# Patient Record
Sex: Female | Born: 1950 | Race: White | Hispanic: No | Smoking: Former smoker
Health system: Southern US, Community
[De-identification: ages and names within clinical notes are randomized; demographics above are authoritative.]

## PROBLEM LIST (undated history)

## (undated) DIAGNOSIS — I10 Essential (primary) hypertension: Secondary | ICD-10-CM

## (undated) DIAGNOSIS — E785 Hyperlipidemia, unspecified: Secondary | ICD-10-CM

## (undated) DIAGNOSIS — A64 Unspecified sexually transmitted disease: Secondary | ICD-10-CM

## (undated) DIAGNOSIS — R32 Unspecified urinary incontinence: Secondary | ICD-10-CM

## (undated) DIAGNOSIS — D72819 Decreased white blood cell count, unspecified: Secondary | ICD-10-CM

## (undated) DIAGNOSIS — M8580 Other specified disorders of bone density and structure, unspecified site: Secondary | ICD-10-CM

## (undated) DIAGNOSIS — M810 Age-related osteoporosis without current pathological fracture: Secondary | ICD-10-CM

## (undated) DIAGNOSIS — R319 Hematuria, unspecified: Secondary | ICD-10-CM

## (undated) DIAGNOSIS — R311 Benign essential microscopic hematuria: Secondary | ICD-10-CM

## (undated) DIAGNOSIS — E079 Disorder of thyroid, unspecified: Secondary | ICD-10-CM

## (undated) DIAGNOSIS — M5416 Radiculopathy, lumbar region: Secondary | ICD-10-CM

## (undated) HISTORY — DX: Radiculopathy, lumbar region: M54.16

## (undated) HISTORY — PX: WISDOM TOOTH EXTRACTION: SHX21

## (undated) HISTORY — DX: Other specified disorders of bone density and structure, unspecified site: M85.80

## (undated) HISTORY — DX: Decreased white blood cell count, unspecified: D72.819

## (undated) HISTORY — DX: Disorder of thyroid, unspecified: E07.9

## (undated) HISTORY — DX: Age-related osteoporosis without current pathological fracture: M81.0

## (undated) HISTORY — DX: Hematuria, unspecified: R31.9

## (undated) HISTORY — DX: Essential (primary) hypertension: I10

## (undated) HISTORY — DX: Hyperlipidemia, unspecified: E78.5

## (undated) HISTORY — DX: Benign essential microscopic hematuria: R31.1

## (undated) HISTORY — PX: TOTAL VAGINAL HYSTERECTOMY: SHX2548

## (undated) HISTORY — DX: Unspecified urinary incontinence: R32

## (undated) HISTORY — PX: TONSILLECTOMY: SUR1361

## (undated) HISTORY — PX: COLONOSCOPY: SHX174

## (undated) HISTORY — DX: Unspecified sexually transmitted disease: A64

---

## 2002-03-04 ENCOUNTER — Encounter: Payer: Self-pay | Admitting: Obstetrics and Gynecology

## 2002-03-04 ENCOUNTER — Encounter: Admission: RE | Admit: 2002-03-04 | Discharge: 2002-03-04 | Payer: Self-pay | Admitting: Obstetrics and Gynecology

## 2002-04-14 ENCOUNTER — Other Ambulatory Visit: Admission: RE | Admit: 2002-04-14 | Discharge: 2002-04-14 | Payer: Self-pay | Admitting: Obstetrics and Gynecology

## 2003-03-09 ENCOUNTER — Encounter: Admission: RE | Admit: 2003-03-09 | Discharge: 2003-03-09 | Payer: Self-pay | Admitting: Internal Medicine

## 2003-04-16 ENCOUNTER — Other Ambulatory Visit: Admission: RE | Admit: 2003-04-16 | Discharge: 2003-04-16 | Payer: Self-pay | Admitting: Obstetrics and Gynecology

## 2004-02-05 ENCOUNTER — Ambulatory Visit: Payer: Self-pay | Admitting: Internal Medicine

## 2004-02-15 ENCOUNTER — Ambulatory Visit: Payer: Self-pay | Admitting: Internal Medicine

## 2004-03-09 ENCOUNTER — Encounter: Admission: RE | Admit: 2004-03-09 | Discharge: 2004-03-09 | Payer: Self-pay | Admitting: Obstetrics and Gynecology

## 2004-04-18 ENCOUNTER — Other Ambulatory Visit: Admission: RE | Admit: 2004-04-18 | Discharge: 2004-04-18 | Payer: Self-pay | Admitting: Obstetrics and Gynecology

## 2005-01-09 ENCOUNTER — Ambulatory Visit: Payer: Self-pay | Admitting: Internal Medicine

## 2005-03-13 ENCOUNTER — Encounter: Admission: RE | Admit: 2005-03-13 | Discharge: 2005-03-13 | Payer: Self-pay | Admitting: Obstetrics and Gynecology

## 2005-04-21 ENCOUNTER — Other Ambulatory Visit: Admission: RE | Admit: 2005-04-21 | Discharge: 2005-04-21 | Payer: Self-pay | Admitting: Obstetrics and Gynecology

## 2005-05-02 ENCOUNTER — Ambulatory Visit: Payer: Self-pay | Admitting: Internal Medicine

## 2005-05-09 ENCOUNTER — Ambulatory Visit: Payer: Self-pay | Admitting: Internal Medicine

## 2005-06-08 ENCOUNTER — Ambulatory Visit: Payer: Self-pay | Admitting: Internal Medicine

## 2006-03-27 ENCOUNTER — Encounter: Admission: RE | Admit: 2006-03-27 | Discharge: 2006-03-27 | Payer: Self-pay | Admitting: Internal Medicine

## 2006-04-23 ENCOUNTER — Other Ambulatory Visit: Admission: RE | Admit: 2006-04-23 | Discharge: 2006-04-23 | Payer: Self-pay | Admitting: Obstetrics & Gynecology

## 2006-05-04 ENCOUNTER — Ambulatory Visit: Payer: Self-pay | Admitting: Internal Medicine

## 2006-05-04 LAB — CONVERTED CEMR LAB
ALT: 34 units/L (ref 0–40)
AST: 29 units/L (ref 0–37)
Basophils Absolute: 0 10*3/uL (ref 0.0–0.1)
Basophils Relative: 0.7 % (ref 0.0–1.0)
Chloride: 108 meq/L (ref 96–112)
Cholesterol: 211 mg/dL (ref 0–200)
Creatinine, Ser: 0.8 mg/dL (ref 0.4–1.2)
Eosinophils Relative: 2 % (ref 0.0–5.0)
GFR calc Af Amer: 95 mL/min
GFR calc non Af Amer: 79 mL/min
Glucose, Bld: 94 mg/dL (ref 70–99)
HDL: 51.3 mg/dL (ref >39.0)
Lymphocytes Relative: 35 % (ref 12.0–46.0)
Monocytes Absolute: 0.4 10*3/uL (ref 0.2–0.7)
Neutro Abs: 3 10*3/uL (ref 1.4–7.7)
Neutrophils Relative %: 54.6 % (ref 43.0–77.0)
RBC: 4.09 M/uL (ref 3.87–5.11)
RDW: 12.2 % (ref 11.5–14.6)
Sodium: 144 meq/L (ref 135–145)
TSH: 0.33 microintl units/mL — ABNORMAL LOW (ref 0.35–5.50)
Total Bilirubin: 0.8 mg/dL (ref 0.3–1.2)
Total CHOL/HDL Ratio: 4.1
Triglycerides: 143 mg/dL (ref 0–149)
VLDL: 29 mg/dL (ref 0–40)

## 2006-05-11 ENCOUNTER — Ambulatory Visit: Payer: Self-pay | Admitting: Internal Medicine

## 2006-05-11 LAB — CONVERTED CEMR LAB: Pap Smear: NORMAL

## 2006-07-10 ENCOUNTER — Ambulatory Visit: Payer: Self-pay | Admitting: Internal Medicine

## 2006-10-25 ENCOUNTER — Ambulatory Visit: Payer: Self-pay | Admitting: Internal Medicine

## 2006-10-25 LAB — CONVERTED CEMR LAB: Vit D, 1,25-Dihydroxy: 45 (ref 20–57)

## 2007-01-16 DIAGNOSIS — M899 Disorder of bone, unspecified: Secondary | ICD-10-CM | POA: Insufficient documentation

## 2007-01-16 DIAGNOSIS — M949 Disorder of cartilage, unspecified: Secondary | ICD-10-CM

## 2007-04-11 LAB — CONVERTED CEMR LAB: Pap Smear: NORMAL

## 2007-05-10 ENCOUNTER — Ambulatory Visit: Payer: Self-pay | Admitting: Internal Medicine

## 2007-05-10 LAB — CONVERTED CEMR LAB
Alkaline Phosphatase: 36 units/L — ABNORMAL LOW (ref 39–117)
BUN: 15 mg/dL (ref 6–23)
CO2: 32 meq/L (ref 19–32)
Creatinine, Ser: 0.8 mg/dL (ref 0.4–1.2)
Direct LDL: 114.9 mg/dL
Eosinophils Absolute: 0.1 10*3/uL (ref 0.0–0.6)
GFR calc Af Amer: 95 mL/min
GFR calc non Af Amer: 79 mL/min
HCT: 38.3 % (ref 36.0–46.0)
HDL: 41.7 mg/dL (ref >39.0)
Lymphocytes Relative: 32.2 % (ref 12.0–46.0)
MCHC: 32.7 g/dL (ref 30.0–36.0)
MCV: 94.3 fL (ref 78.0–100.0)
Monocytes Absolute: 0.5 10*3/uL (ref 0.2–0.7)
Monocytes Relative: 8.1 % (ref 3.0–11.0)
Neutrophils Relative %: 57.8 % (ref 43.0–77.0)
Platelets: 213 10*3/uL (ref 150–400)
Potassium: 4 meq/L (ref 3.5–5.1)
RBC: 4.06 M/uL (ref 3.87–5.11)
Sodium: 143 meq/L (ref 135–145)
Total Bilirubin: 0.9 mg/dL (ref 0.3–1.2)
Total CHOL/HDL Ratio: 4.7
Total Protein: 6.5 g/dL (ref 6.0–8.3)
VLDL: 41 mg/dL — ABNORMAL HIGH (ref 0–40)
WBC: 5.6 10*3/uL (ref 4.5–10.5)

## 2007-05-12 LAB — CONVERTED CEMR LAB
Bilirubin Urine: NEGATIVE
Glucose, Urine, Semiquant: NEGATIVE
Ketones, urine, test strip: NEGATIVE
Nitrite: NEGATIVE
Specific Gravity, Urine: 1.02
Urobilinogen, UA: 0.2

## 2007-05-17 ENCOUNTER — Ambulatory Visit: Payer: Self-pay | Admitting: Internal Medicine

## 2007-05-17 DIAGNOSIS — R319 Hematuria, unspecified: Secondary | ICD-10-CM | POA: Insufficient documentation

## 2007-05-17 DIAGNOSIS — E781 Pure hyperglyceridemia: Secondary | ICD-10-CM | POA: Insufficient documentation

## 2007-05-17 HISTORY — DX: Hematuria, unspecified: R31.9

## 2007-05-17 LAB — CONVERTED CEMR LAB
Bilirubin Urine: NEGATIVE
Protein, U semiquant: NEGATIVE
Urobilinogen, UA: 0.2

## 2007-05-24 ENCOUNTER — Other Ambulatory Visit: Admission: RE | Admit: 2007-05-24 | Discharge: 2007-05-24 | Payer: Self-pay | Admitting: Obstetrics and Gynecology

## 2008-02-01 ENCOUNTER — Encounter: Payer: Self-pay | Admitting: Internal Medicine

## 2008-02-10 ENCOUNTER — Ambulatory Visit: Payer: Self-pay | Admitting: Internal Medicine

## 2008-02-10 DIAGNOSIS — J069 Acute upper respiratory infection, unspecified: Secondary | ICD-10-CM | POA: Insufficient documentation

## 2008-04-08 ENCOUNTER — Ambulatory Visit: Payer: Self-pay | Admitting: Internal Medicine

## 2008-04-08 DIAGNOSIS — M79609 Pain in unspecified limb: Secondary | ICD-10-CM | POA: Insufficient documentation

## 2008-04-08 DIAGNOSIS — E785 Hyperlipidemia, unspecified: Secondary | ICD-10-CM

## 2008-04-08 HISTORY — DX: Hyperlipidemia, unspecified: E78.5

## 2008-04-15 ENCOUNTER — Encounter: Payer: Self-pay | Admitting: Internal Medicine

## 2008-04-17 ENCOUNTER — Encounter: Payer: Self-pay | Admitting: Internal Medicine

## 2008-05-13 ENCOUNTER — Ambulatory Visit: Payer: Self-pay | Admitting: Internal Medicine

## 2008-05-26 ENCOUNTER — Ambulatory Visit: Payer: Self-pay | Admitting: Internal Medicine

## 2008-07-20 ENCOUNTER — Ambulatory Visit: Payer: Self-pay | Admitting: Internal Medicine

## 2008-07-20 DIAGNOSIS — R21 Rash and other nonspecific skin eruption: Secondary | ICD-10-CM | POA: Insufficient documentation

## 2009-03-18 ENCOUNTER — Telehealth: Payer: Self-pay | Admitting: Internal Medicine

## 2009-04-08 ENCOUNTER — Encounter: Payer: Self-pay | Admitting: Internal Medicine

## 2009-04-16 ENCOUNTER — Encounter: Payer: Self-pay | Admitting: Internal Medicine

## 2009-04-16 LAB — HM MAMMOGRAPHY

## 2009-05-26 ENCOUNTER — Ambulatory Visit: Payer: Self-pay | Admitting: Internal Medicine

## 2009-05-26 LAB — CONVERTED CEMR LAB
Basophils Absolute: 0 10*3/uL (ref 0.0–0.1)
Basophils Relative: 0.1 % (ref 0.0–3.0)
Bilirubin Urine: NEGATIVE
Bilirubin, Direct: 0.1 mg/dL (ref 0.0–0.3)
Blood in Urine, dipstick: NEGATIVE
Chloride: 108 meq/L (ref 96–112)
Cholesterol: 210 mg/dL — ABNORMAL HIGH (ref 0–200)
Direct LDL: 131.6 mg/dL
Eosinophils Absolute: 0.1 10*3/uL (ref 0.0–0.7)
GFR calc non Af Amer: 77.89 mL/min (ref >60)
HDL: 54.1 mg/dL (ref >39.00)
Lymphocytes Relative: 29.7 % (ref 12.0–46.0)
Lymphs Abs: 1.5 10*3/uL (ref 0.7–4.0)
MCHC: 33.8 g/dL (ref 30.0–36.0)
Neutrophils Relative %: 61.2 % (ref 43.0–77.0)
Nitrite: NEGATIVE
Potassium: 3.7 meq/L (ref 3.5–5.1)
Protein, U semiquant: NEGATIVE
RDW: 12.3 % (ref 11.5–14.6)
Sodium: 143 meq/L (ref 135–145)
Total Bilirubin: 0.7 mg/dL (ref 0.3–1.2)
Total Protein: 6.9 g/dL (ref 6.0–8.3)
Triglycerides: 175 mg/dL — ABNORMAL HIGH (ref 0.0–149.0)
Vit D, 25-Hydroxy: 32 ng/mL (ref 30–89)
WBC Urine, dipstick: NEGATIVE

## 2009-06-02 ENCOUNTER — Ambulatory Visit: Payer: Self-pay | Admitting: Internal Medicine

## 2009-08-19 ENCOUNTER — Encounter (INDEPENDENT_AMBULATORY_CARE_PROVIDER_SITE_OTHER): Payer: Self-pay | Admitting: *Deleted

## 2009-08-30 ENCOUNTER — Ambulatory Visit: Payer: Self-pay | Admitting: Internal Medicine

## 2009-09-02 LAB — CONVERTED CEMR LAB
Free T4: 0.8 ng/dL (ref 0.6–1.6)
T3, Free: 2.7 pg/mL (ref 2.3–4.2)

## 2009-09-17 ENCOUNTER — Telehealth: Payer: Self-pay | Admitting: *Deleted

## 2009-09-24 ENCOUNTER — Encounter (INDEPENDENT_AMBULATORY_CARE_PROVIDER_SITE_OTHER): Payer: Self-pay | Admitting: *Deleted

## 2009-09-29 ENCOUNTER — Ambulatory Visit: Payer: Self-pay | Admitting: Internal Medicine

## 2009-10-22 ENCOUNTER — Ambulatory Visit: Payer: Self-pay | Admitting: Endocrinology

## 2009-10-22 DIAGNOSIS — E042 Nontoxic multinodular goiter: Secondary | ICD-10-CM | POA: Insufficient documentation

## 2009-11-01 ENCOUNTER — Telehealth (INDEPENDENT_AMBULATORY_CARE_PROVIDER_SITE_OTHER): Payer: Self-pay | Admitting: *Deleted

## 2009-12-21 ENCOUNTER — Telehealth: Payer: Self-pay | Admitting: Endocrinology

## 2009-12-21 ENCOUNTER — Ambulatory Visit: Payer: Self-pay | Admitting: Endocrinology

## 2010-02-17 ENCOUNTER — Encounter (HOSPITAL_COMMUNITY)
Admission: RE | Admit: 2010-02-17 | Discharge: 2010-04-08 | Payer: Self-pay | Source: Home / Self Care | Attending: Endocrinology | Admitting: Endocrinology

## 2010-02-21 ENCOUNTER — Telehealth: Payer: Self-pay | Admitting: Endocrinology

## 2010-03-02 ENCOUNTER — Ambulatory Visit (HOSPITAL_COMMUNITY)
Admission: RE | Admit: 2010-03-02 | Discharge: 2010-03-02 | Payer: Self-pay | Source: Home / Self Care | Admitting: Endocrinology

## 2010-04-21 ENCOUNTER — Encounter: Payer: Self-pay | Admitting: Internal Medicine

## 2010-05-08 LAB — CONVERTED CEMR LAB
AST: 20 units/L (ref 0–37)
Albumin ELP: 61.2 % (ref 55.8–66.1)
Albumin: 3.7 g/dL (ref 3.5–5.2)
Alkaline Phosphatase: 46 units/L (ref 39–117)
BUN: 16 mg/dL (ref 6–23)
Basophils Relative: 0.1 % (ref 0.0–3.0)
Bilirubin Urine: NEGATIVE
Bilirubin, Direct: 0.1 mg/dL (ref 0.0–0.3)
Blood in Urine, dipstick: NEGATIVE
Eosinophils Relative: 2.1 % (ref 0.0–5.0)
Ferritin: 67.4 ng/mL (ref 10.0–291.0)
GFR calc Af Amer: 95 mL/min
GFR calc non Af Amer: 78 mL/min
HCT: 36.6 % (ref 36.0–46.0)
Hemoglobin: 12.8 g/dL (ref 12.0–15.0)
Ketones, urine, test strip: NEGATIVE
MCHC: 35 g/dL (ref 30.0–36.0)
MCV: 91.8 fL (ref 78.0–100.0)
Monocytes Relative: 8.6 % (ref 3.0–12.0)
Neutro Abs: 2.6 10*3/uL (ref 1.4–7.7)
Neutrophils Relative %: 57.3 % (ref 43.0–77.0)
Platelets: 216 10*3/uL (ref 150–400)
Potassium: 3.9 meq/L (ref 3.5–5.1)
Protein, U semiquant: NEGATIVE
RDW: 12 % (ref 11.5–14.6)
Specific Gravity, Urine: 1.025
Total Bilirubin: 0.6 mg/dL (ref 0.3–1.2)
Total Protein, Serum Electrophoresis: 6.8 g/dL (ref 6.0–8.3)
WBC Urine, dipstick: NEGATIVE
pH: 5

## 2010-05-10 NOTE — Letter (Signed)
Summary: Previsit letter  [facility]  [address] [place], [place] [address]   Phone: [phone]  Fax: [phone]       08/19/2009 MRN: [id number]  [facility] [place] [address] [place], [place]  [address]  Dear [name],  Welcome to the Gastroenterology Division at [facility].    You are scheduled to see a nurse for your pre-procedure visit on 09-07-09 at 9:30am on the 3rd floor at [facility], [address]  We ask that you try to arrive at our office 15 minutes prior to your appointment time to allow for check-in.  Your nurse visit will consist of discussing your medical and surgical history, your immediate family medical history, and your medications.    Please bring a complete list of all your medications or, if you prefer, bring the medication bottles and we will list them.  We will need to be aware of both prescribed and over the counter drugs.  We will need to know exact dosage information as well.  If you are on blood thinners (Coumadin, Plavix, Aggrenox, Ticlid, etc.) please call our office today/prior to your appointment, as we need to consult with your physician about holding your medication.   Please be prepared to read and sign documents such as consent forms, a financial agreement, and acknowledgement forms.  If necessary, and with your consent, a friend or relative is welcome to sit-in on the nurse visit with you.  Please bring your insurance card so that we may make a copy of it.  If your insurance requires a referral to see a specialist, please bring your referral form from your primary care physician.  No co-pay is required for this nurse visit.     If you cannot keep your appointment, please call [phone] to cancel or reschedule prior to your appointment date.  This allows [place] the opportunity to schedule an appointment for another patient in need of care.    Thank you for choosing [facility] for your medical needs.   We appreciate the opportunity to care for you.  Please visit [place] at our website  to learn more about our practice.                     Sincerely.                                                                                                                   The Gastroenterology Division

## 2010-05-10 NOTE — Progress Notes (Signed)
  Phone Note Other Incoming   Request: Send information Summary of Call: Request for records received from [facility]. Request forwarded to [employer].     

## 2010-05-10 NOTE — Letter (Signed)
Summary: [facility] Instructions  [facility]  [address] [place], [place] [address]   Phone: [phone]  Fax: [phone]       Laura Brooks    May 23, 1950    MRN: [id number]       Procedure Day Dorna Bloom:  [facility]  10/13/09     Arrival Time:  8:30AM     Procedure Time:  9:30AM     Location of Procedure:                    _X_  [facility] (4th Floor)    PREPARATION FOR COLONOSCOPY WITH MIRALAX  Starting 5 days prior to your procedure 10/08/09 do not eat nuts, seeds, popcorn, corn, beans, peas,  salads, or any raw vegetables.  Do not take any fiber supplements (e.g. Metamucil, Citrucel, and Benefiber). ____________________________________________________________________________________________________   THE DAY BEFORE YOUR PROCEDURE         DATE: 10/12/09  DAY: TUESDAY  1   Drink clear liquids the entire day-NO SOLID FOOD  2   Do not drink anything colored red or purple.  Avoid juices with pulp.  No orange juice.  3   Drink at least 64 oz. (8 glasses) of fluid/clear liquids during the day to prevent dehydration and help the prep work efficiently.  CLEAR LIQUIDS INCLUDE: Water Jello Ice Popsicles Tea (sugar ok, no milk/cream) Powdered fruit flavored drinks Coffee (sugar ok, no milk/cream) Gatorade Juice: apple, white grape, white cranberry  Lemonade Clear bullion, consomm, broth Carbonated beverages (any kind) Strained chicken noodle soup Hard Candy  4   Mix the entire bottle of Miralax with 64 oz. of Gatorade/Powerade in the morning and put in the refrigerator to chill.  5   At 3:00 pm take 2 Dulcolax/Bisacodyl tablets.  6   At 4:30 pm take one Reglan/Metoclopramide tablet.  7  Starting at 5:00 pm drink one 8 oz glass of the Miralax mixture every 15-20 minutes until you have finished drinking the entire 64 oz.  You should finish drinking prep around 7:30 or 8:00 pm.  8   If you are nauseated, you may take the 2nd Reglan/Metoclopramide  tablet at 6:30 pm.        9    At 8:00 pm take 2 more DULCOLAX/Bisacodyl tablets.     THE DAY OF YOUR PROCEDURE      DATE:  10/13/09  DAY: Lulu Riding  You may drink clear liquids until 7:30AM  (2 HOURS BEFORE PROCEDURE).   MEDICATION INSTRUCTIONS  Unless otherwise instructed, you should take regular prescription medications with a small sip of water as early as possible the morning of your procedure.        OTHER INSTRUCTIONS  You will need a responsible adult at least 61 years of age to accompany you and drive you home.   This person must remain in the waiting room during your procedure.  Wear loose fitting clothing that is easily removed.  Leave jewelry and other valuables at home.  However, you may wish to bring a book to read or an iPod/MP3 player to listen to music as you wait for your procedure to start.  Remove all body piercing jewelry and leave at home.  Total time from sign-in until discharge is approximately 2-3 hours.  You should go home directly after your procedure and rest.  You can resume normal activities the day after your procedure.  The day of your procedure you should not:   Drive  Make legal decisions   Operate machinery   Drink alcohol   Return to work  You will receive specific instructions about eating, activities and medications before you leave.   The above instructions have been reviewed and explained to me by   Wyona Almas RN  September 29, 2009 8:34 AM     I fully understand and can verbalize these instructions _____________________________ Date _______

## 2010-05-10 NOTE — Assessment & Plan Note (Signed)
Summary: NEW / ABN TSH / PANOSH / NOTES ON EMR / CD   Vital Signs:  Patient profile:   60 year old female Menstrual status:  postmenopausal Height:      61 inches (154.94 cm) Weight:      155.13 pounds (70.51 kg) BMI:     29.42 O2 Sat:      98 % on Room air Temp:     98.2 degrees F (36.78 degrees C) oral Pulse rate:   71 / minute BP sitting:   112 / 74  (left arm) Cuff size:   regular  Vitals Entered By: Brenton Grills MA (October 22, 2009 8:26 AM)  O2 Flow:  Room air CC: New Endo/Abn TSH Dr. Loralee Pacas Comments Pt is currently taking Vitamin D3 and Premarin--Valtrex as needed   CC:  New Endo/Abn TSH Dr. Loralee Pacas.  History of Present Illness: pt has been noted over the past few years to have low-normal tsh.  this year, it is low.  she has few years of slight itching, worst at the scalp.  no associated rash.  pt has a high ins deductible this year.  she will have tah next year.  Current Medications (verified): 1)  Valtrex 1 Gm Tabs (Valacyclovir Hcl) 2)  Vitamin D3 1000 Unit Tabs (Cholecalciferol) .... Take 3)  Premarin 0.625 Mg/gm Crea (Estrogens, Conjugated) 4)  Metoclopramide Hcl 10 Mg  Tabs (Metoclopramide Hcl) .... As Per Prep Instructions. 5)  Miralax   Powd (Polyethylene Glycol 3350) .... As Per Prep  Instructions.  Allergies (verified): No Known Drug Allergies  Past History:  Past Medical History: Last updated: 06/02/2009 Osteopenia g2p2 microscopic hematuria  had evaluation bone health Radiculopathy     l4-5  Consults: Dr. Nilsa Nutting   Ortho        LAST Mammogram: 2009 Pap: scheduled for 05/29/2008 Td: 09/1998 Colonscopy: 2001 EKG: 05/11/2006 Dexa: 2009 Eye Exam: 2009 Other: Smoking: former  Family History: Reviewed history from 06/02/2009 and no changes required. Father: anemia   Mother: Good Health for age Siblings: Good Health for age   Children with Migraines     mom (thyroidect) and sister (i-131 rx), neither had thyroid cancer.  Social  History: Reviewed history from 06/02/2009 and no changes required. Single  divorced  Former Smoker  ns Alcohol use-no Drug use-no Regular exercise-no work [employer]    HH  of 2  pet parrot and fish.  Review of Systems       denies weight loss, hoarseness, visual loss, palpitations, sob, diarrhea, polyuria, myalgias, excessive diaphoresis, numbness, tremor, anxiety, menopausal sxs, and rhinorrhea.  she has intermittent mild headache, and easy bruising.  Physical Exam  General:  normal appearance.   Head:  head: no deformity eyes: no periorbital swelling, no proptosis external nose and ears are normal mouth: no lesion seen Neck:  small multinodular goiter Lungs:  Clear to auscultation bilaterally. Normal respiratory effort.  Heart:  Regular rate and rhythm without murmurs or gallops noted. Normal S1,S2.   Msk:  muscle bulk and strength are grossly normal.  no obvious joint swelling.  gait is normal and steady  Extremities:  no deformity.  no ulcer on the feet.  feet are of normal color and temp.  no edema  Neurologic:  cn 2-12 grossly intact.   readily moves all 4's.   sensation is intact to touch on the feet  Skin:  normal texture and temp.  no rash.  not diaphoretic  Cervical Nodes:  No  significant adenopathy.  Psych:  Alert and cooperative; normal mood and affect; normal attention span and concentration.     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 1 once daily  Other Orders: Consultation Level IV ([address])  Patient Instructions: 1)  take methimazole 5 mg once daily 2)  if ever you have fever while taking this medication, stop it and call us, because  of the risk of a rare side-effect 3)  return 2 months. 4)  next year, we'll pursue radioactive iodine treatment. 5)  cc patty grubb. Prescriptions: METHIMAZOLE 5 MG TABS (METHIMAZOLE) 1 once daily  #30 x 3   Entered and Authorized by:   Minus Breeding MD   Signed by:   Minus Breeding MD on 10/22/2009   Method used:   Electronically to        [facility]  [phone]* (retail)       [address]       [place], [place]  [address]       Ph: [phone] or [phone]       Fax: [phone]   RxID:   [id number]

## 2010-05-10 NOTE — Assessment & Plan Note (Signed)
Summary: cpx/no pap/njr   Vital Signs:  Patient profile:   60 year old female Menstrual status:  postmenopausal Height:      61 inches Weight:      157 pounds BMI:     29.77 Pulse rate:   72 / minute BP sitting:   110 / 70  (right arm) Cuff size:   regular  Vitals Entered By: Romualdo Bolk, CMA (AAMA) (June 02, 2009 9:46 AM) CC: CPX- No pap- Pt has a gyn who does paps   History of Present Illness: Laura Brooks comesin today for  preventive visit . Since last visit  here  there have been no major changes in health status  .   Ses gyne  .  trying to eat better   .  healthier   had bone density no fractures  Preventive Care Screening  Last Tetanus Booster:    Date:  06/02/2009    Results:  Tdap  T-score L hip:    Date:  04/16/2009    Results:  -2.4 SDs  Bone Density:    Date:  04/16/2009    Results:  osteoporosis std dev  Mammogram:    Date:  04/16/2009    Results:  normal   Pap Smear:    Date:  08/08/2008    Results:  normal   Prior Values:    Pap Smear:  Normal (04/11/2007)    Mammogram:  Normal Bilateral (04/11/2007)    Colonoscopy:  historical (04/11/1999)    Bone Density:  Normal (04/11/2007)    Last Tetanus Booster:  Td (09/09/1998)    Dexa Interp:  Normal (04/11/2007)   Preventive Screening-Counseling & Management  Alcohol-Tobacco     Alcohol drinks/day: <1     Alcohol type: wine     Smoking Status: quit     Year Quit: 30 years ago  Caffeine-Diet-Exercise     Caffeine use/day: 1-2     Does Patient Exercise: no  Hep-HIV-STD-Contraception     Dental Visit-last 6 months yes     Sun Exposure-Excessive: no  Safety-Violence-Falls     Seat Belt Use: yes     Firearms in the Home: firearms in the home     Firearm Counseling: not indicated; uses recommended firearm safety measures     Smoke Detectors: yes      Blood Transfusions:  no.    EKG  Procedure date:  05/11/2006  Findings:      Normal sinus rhythm with rate of:  71 Low QRS  voltage in precordial leads   Current Medications (verified): 1)  Valtrex 1 Gm Tabs (Valacyclovir Hcl) 2)  Vitamin D3 1000 Unit Tabs (Cholecalciferol) .... Take 3)  Premarin 0.625 Mg/gm Crea (Estrogens, Conjugated)  Allergies (verified): No Known Drug Allergies  Past History:  Past medical, surgical, family and social histories (including risk factors) reviewed, and no changes noted (except as noted below).  Past Medical History: Osteopenia g2p2 microscopic hematuria  had evaluation bone health Radiculopathy     l4-5  Consults: Dr. Nilsa Nutting   Ortho        LAST Mammogram: 2009 Pap: scheduled for 05/29/2008 Td: 09/1998 Colonscopy: 2001 EKG: 05/11/2006 Dexa: 2009 Eye Exam: 2009 Other: Smoking: former  Past Surgical History: Reviewed history from 01/16/2007 and no changes required. Tonsillectomy G2P2  Past History:  Care Management: Gynecology: Dr. Genice Rouge Orthopedics: Dr. Eulah Pont Gastroenterology: [place], [place]  Family History: Reviewed history from 05/26/2008 and no changes required. family hx thyroid Father: anemia   Mother:  Good Health for age Siblings: Good Health for age   Children with Migraines      thyroid disease mom and sib   Social History: Reviewed history from 05/26/2008 and no changes required. Single  divorced  Former Smoker  ns Alcohol use-no Drug use-no Regular exercise-no work [employer] [employer]    HH  of 2  pet parrot and fish .   Caffeine use/day:  1-2 Dental Care w/in 6 mos.:  yes Sun Exposure-Excessive:  no Seat Belt Use:  yes Blood Transfusions:  no  Review of Systems  The patient denies anorexia, fever, weight loss, weight gain, vision loss, decreased hearing, hoarseness, chest pain, syncope, dyspnea on exertion, peripheral edema, prolonged cough, headaches, hemoptysis, abdominal pain, melena, hematochezia, severe indigestion/heartburn, hematuria, muscle weakness, suspicious skin lesions, transient blindness,  difficulty walking, depression, unusual weight change, abnormal bleeding, enlarged lymph nodes, angioedema, and breast masses.   Physical Exam General Appearance: well developed, well nourished, no acute distress Eyes: conjunctiva and lids normal, PERRLA, EOMI, WNL Ears, Nose, Mouth, Throat: TM clear, nares clear, oral exam WNL Neck: supple, no lymphadenopathy, no thyromegaly, no JVD Respiratory: clear to auscultation and percussion, respiratory effort normal Cardiovascular: regular rate and rhythm, S1-S2, no murmur, rub or gallop, no bruits, peripheral pulses normal and symmetric, no cyanosis, clubbing, edema or varicosities Chest: no scars, masses, tenderness; no asymmetry, skin changes, nipple discharge   Gastrointestinal: soft, non-tender; no hepatosplenomegaly, masses; active bowel sounds all quadrants,  Genitourinary: per gyn Lymphatic: no cervical, axillary or inguinal adenopathy Musculoskeletal: gait normal, muscle tone and strength WNL, no joint swelling, effusions, discoloration, crepitus  Skin: clear, good turgor, color WNL, no rashes, lesions, or ulcerations Neurologic: normal mental status, normal reflexes, normal strength, sensation, and motion Psychiatric: alert; oriented to person, place and time Other Exam: lab   ok except tsh sligltly low     Impression & Recommendations:  Problem # 1:  PREVENTIVE HEALTH CARE (ICD-V70.0)  Orders: Gastroenterology Referral (GI)  Problem # 2:  HYPERLIPIDEMIA (ICD-272.4) Assessment: Improved  Problem # 3:  OSTEOPENIA (ICD-733.90) Assessment: Improved see bone density  The following medications were removed from the medication list:    Fosamax 70 Mg Tabs (Alendronate sodium) Her updated medication list for this problem includes:    Vitamin D3 1000 Unit Tabs (Cholecalciferol) .Marland Kitchen... Take  Problem # 4:  THYROID STIMULATING HORMONE, ABNORMAL (ICD-246.9) hx x 1 of same  poss    sub clinical hyperthyroid.      feeling well.    fam hx of  thyroid disesase   no signs. Patient     would prefer to recheck labs before any referrals .   Complete Medication List: 1)  Valtrex 1 Gm Tabs (Valacyclovir hcl) 2)  Vitamin D3 1000 Unit Tabs (Cholecalciferol) .... Take 3)  Premarin 0.625 Mg/gm Crea (Estrogens, conjugated)  Other Orders: Tdap => 57yrs IM ([address]) Admin 1st Vaccine ([address])  Patient Instructions: 1)  will   do colonoscopy referral  for June. 2)  Reat TSH and free t4 and free t3 in 2-3 months  . If still abnormal rec  Endocrine consults.     Immunizations Administered:  Tetanus Vaccine:    Vaccine Type: Tdap    Site: right deltoid    Mfr: GlaxoSmithKline    Dose: 0.5 ml    Route: IM    Given by: Romualdo Bolk, CMA (AAMA)    Exp. Date: 06/05/2011    Lot #: [id number]

## 2010-05-10 NOTE — Miscellaneous (Signed)
Summary: [facility] Previsit/prep  Clinical Lists Changes  Medications: Added new medication of METOCLOPRAMIDE HCL 10 MG  TABS (METOCLOPRAMIDE HCL) As per prep instructions. - Signed Added new medication of MIRALAX   POWD (POLYETHYLENE GLYCOL 3350) As per prep  instructions. - Signed Rx of METOCLOPRAMIDE HCL 10 MG  TABS (METOCLOPRAMIDE HCL) As per prep instructions.;  #2 x 0;  Signed;  Entered by: Wyona Almas RN;  Authorized by: Hart Carwin MD;  Method used: Electronically to [facility]  [phone]*, [address] [place], [place], [place]  [address], Ph: [phone] or [phone], Fax: [phone] Rx of MIRALAX   POWD (POLYETHYLENE GLYCOL 3350) As per prep  instructions.;  #255gm x 0;  Signed;  Entered by: Wyona Almas RN;  Authorized by: Hart Carwin MD;  Method used: Electronically to [facility]  [phone]*, [address] [place], [place], [place]  [address], Ph: [phone] or [phone], Fax: [phone] Observations: Added new observation of NKA: T (09/29/2009 8:09)    Prescriptions: MIRALAX   POWD (POLYETHYLENE GLYCOL 3350) As per prep  instructions.  #255gm x 0   Entered by:   Wyona Almas RN   Authorized by:   Hart Carwin MD   Signed by:   Wyona Almas RN on 09/29/2009   Method used:   Electronically to        [facility]  [phone]* (retail)       [address]       [place], [place]  [address]       Ph: [phone] or [phone]       Fax: [phone]   RxID:   [id number] METOCLOPRAMIDE HCL 10 MG  TABS (METOCLOPRAMIDE HCL) As per prep instructions.  #2 x 0   Entered by:   Wyona Almas RN   Authorized by:   Hart Carwin MD   Signed by:   Wyona Almas RN on 09/29/2009   Method used:   Electronically to        [facility]  [phone]* (retail)       [address]       [place], [place]  [address]       Ph: [phone] or [phone]  Fax: [phone]   RxID:   [id number]

## 2010-05-10 NOTE — Assessment & Plan Note (Signed)
Summary: 2 mth fu---stc   Vital Signs:  Patient profile:   60 year old female Menstrual status:  postmenopausal Height:      61 inches (154.94 cm) Weight:      155.13 pounds (70.51 kg) BMI:     29.42 O2 Sat:      97 % on Room air Temp:     98.0 degrees F (36.67 degrees C) oral Pulse rate:   69 / minute BP sitting:   108 / 70  (left arm) Cuff size:   regular  Vitals Entered By: Brenton Grills MA (December 21, 2009 7:58 AM)  O2 Flow:  Room air CC: 2 month F/U/aj   CC:  2 month F/U/aj.  History of Present Illness: pt states she feels no different, and well in general.  she does not notice the goiter.  she wants to pursue i-131 rx, the day before thanksgiving.  Current Medications (verified): 1)  Valtrex 1 Gm Tabs (Valacyclovir Hcl) .... As Needed 2)  Vitamin D3 1000 Unit Tabs (Cholecalciferol) .... Take 3)  Premarin 0.625 Mg/gm Crea (Estrogens, Conjugated) 4)  Methimazole 5 Mg Tabs (Methimazole) .Marland Kitchen.. 1 Once Daily  Allergies (verified): No Known Drug Allergies  Past History:  Past Medical History: Last updated: 06/02/2009 Osteopenia g2p2 microscopic hematuria  had evaluation bone health Radiculopathy     l4-5  Consults: Dr. Nilsa Nutting   Ortho        LAST Mammogram: 2009 Pap: scheduled for 05/29/2008 Td: 09/1998 Colonscopy: 2001 EKG: 05/11/2006 Dexa: 2009 Eye Exam: 2009 Other: Smoking: former  Review of Systems  The patient denies weight loss and weight gain.    Physical Exam  General:  normal appearance.   Neck:  small multinodular goiter, right > left.   Impression & Recommendations:  Problem # 1:  GOITER, MULTINODULAR (ICD-241.1) pt now wants to pursue i-131 rx  Problem # 2:  HYPERTHYROIDISM (ICD-242.90) due to #1  Medications Added to Medication List This Visit: 1)  Valtrex 1 Gm Tabs (Valacyclovir hcl) .... As needed  Other Orders: Radiology Referral (Radiology) Est. Patient Level III ([address])  Patient Instructions: 1)  please stop  methimazole.   2)  please do thyroid nuclear med scan beginning of november.  then tentative plan will be for radioactive iodine treatment the day before thanksgiving.  you will be called with a day and time for an appointment for each of these.

## 2010-05-10 NOTE — Progress Notes (Signed)
Summary: ? about iodine treatment  Phone Note Call from Patient Call back at Home Phone [phone]   Caller: Patient Summary of Call: Pt's states that she discussed with MD  to take radioactive iodine treatment around Thankgiving, but is unsure if she can do this because she lives with fiance and has pets and that he will be unable to leave the residence at that time. She is asking if she can be in the residence with them during this time but enclosed in another room. Please advise Initial call taken by: Brenton Grills MA,  December 21, 2009 10:05 AM  Follow-up for Phone Call        the advice that the hospital personnel will give you is for no other people to be in the house, especially children and pregnant women.  however, there is no known risk to others in the house. Follow-up by: Minus Breeding MD,  December 21, 2009 11:01 AM  Additional Follow-up for Phone Call Additional follow up Details #1::        Called pt informed of above information. Patient will have treatments done, but due to her schedule will reschedule appt. Additional Follow-up by: Robin Ewing CMA Duncan Dull),  December 21, 2009 11:33 AM

## 2010-05-10 NOTE — Progress Notes (Signed)
Summary: labs and tetanus  Phone Note Call from Patient   Caller: Patient Call For: Madelin Headings MD Summary of Call: Needs to know what type of Tetanus she had at her last physical.  Needs last lab work faxed to Shirlyn Goltz FNP at fax number [phone]  [phone]  Pt. Initial call taken by: Lynann Beaver CMA,  September 17, 2009 12:55 PM  Follow-up for Phone Call        Pt had Tdap. Labs faxed to Shirlyn Goltz, NP for pt. Follow-up by: Romualdo Bolk, CMA Duncan Dull),  September 17, 2009 12:57 PM

## 2010-05-10 NOTE — Progress Notes (Signed)
Summary: Results  Phone Note Call from Patient Call back at Home Phone [phone]   Caller: Patient Summary of Call: Pt called requesting detail results of throid uptake and scan. Please advise Initial call taken by: Margaret Pyle, CMA,  February 21, 2010 11:08 AM  Follow-up for Phone Call        on phone-tree Follow-up by: Minus Breeding MD,  February 21, 2010 11:27 AM  Additional Follow-up for Phone Call Additional follow up Details #1::        Pt informed  Additional Follow-up by: Lamar Sprinkles, CMA,  February 22, 2010 12:09 PM

## 2010-05-11 ENCOUNTER — Other Ambulatory Visit: Payer: Self-pay | Admitting: Endocrinology

## 2010-05-11 ENCOUNTER — Encounter: Payer: Self-pay | Admitting: Endocrinology

## 2010-05-11 ENCOUNTER — Ambulatory Visit: Admit: 2010-05-11 | Payer: Self-pay | Admitting: Endocrinology

## 2010-05-11 ENCOUNTER — Ambulatory Visit (INDEPENDENT_AMBULATORY_CARE_PROVIDER_SITE_OTHER): Payer: BC Managed Care – PPO | Admitting: Endocrinology

## 2010-05-11 DIAGNOSIS — E0590 Thyrotoxicosis, unspecified without thyrotoxic crisis or storm: Secondary | ICD-10-CM

## 2010-05-11 DIAGNOSIS — E042 Nontoxic multinodular goiter: Secondary | ICD-10-CM

## 2010-05-11 LAB — T4, FREE: Free T4: 0.7 ng/dL (ref 0.60–1.60)

## 2010-05-11 LAB — TSH: TSH: 3.7 u[IU]/mL (ref 0.35–5.50)

## 2010-05-16 ENCOUNTER — Encounter: Payer: Self-pay | Admitting: Internal Medicine

## 2010-05-18 NOTE — Assessment & Plan Note (Signed)
Summary: FU/NWS # RS'D TO 815 PER EPIC/NWS   Vital Signs:  Patient profile:   60 year old female Menstrual status:  postmenopausal Height:      61 inches (154.94 cm) Weight:      157.25 pounds (71.48 kg) BMI:     29.82 O2 Sat:      97 % on Room air Temp:     98.0 degrees F (36.67 degrees C) oral Pulse rate:   77 / minute Pulse rhythm:   regular BP sitting:   108 / 76  (left arm) Cuff size:   regular  Vitals Entered By: Brenton Grills CMA (AAMA) (May 11, 2010 8:21 AM)  O2 Flow:  Room air CC: Follow up on thyroid/aj Is Patient Diabetic? No   CC:  Follow up on thyroid/aj.  History of Present Illness: pt is now 2 1/2 mos s/p i-131 rx for hyperthyroidism, due to a multinodular goiter.  pt states she feels no different, well in general.  she does not notice the goiter.   Current Medications (verified): 1)  Valtrex 1 Gm Tabs (Valacyclovir Hcl) .... As Needed 2)  Vitamin D3 1000 Unit Tabs (Cholecalciferol) .... Take 3)  Premarin 0.625 Mg/gm Crea (Estrogens, Conjugated)  Allergies (verified): No Known Drug Allergies  Past History:  Past Medical History: Last updated: 06/02/2009 Osteopenia g2p2 microscopic hematuria  had evaluation bone health Radiculopathy     l4-5  Consults: Dr. Nilsa Nutting   Ortho        LAST Mammogram: 2009 Pap: scheduled for 05/29/2008 Td: 09/1998 Colonscopy: 2001 EKG: 05/11/2006 Dexa: 2009 Eye Exam: 2009 Other: Smoking: former  Review of Systems       denies neck pain  Physical Exam  General:  normal appearance.   Neck:  small multinodular goiter, right > left. Additional Exam:  FastTSH                   3.70 uIU/mL                 0.35-5.50 Free T4                   0.70 ng/dL     Impression & Recommendations:  Problem # 1:  GOITER, MULTINODULAR (ICD-241.1) Assessment Unchanged  Problem # 2:  HYPERTHYROIDISM (ICD-242.90) Assessment: Improved  Other Orders: TLB-TSH (Thyroid Stimulating Hormone) (84443-TSH) TLB-T4  (Thyrox), Free [phone]) Est. Patient Level III (91478)  Patient Instructions: 1)  blood tests are being ordered for you today.  please call [phone] to hear your test results. 2)  Please schedule a follow-up appointment in 4-6 weeks. 3)  (update: i left message on phone-tree:  labs are better.  ret as scheduled).   Orders Added: 1)  TLB-TSH (Thyroid Stimulating Hormone) [84443-TSH] 2)  TLB-T4 (Thyrox), Free [[id number]] 3)  Est. Patient Level III [62952]

## 2010-06-02 ENCOUNTER — Other Ambulatory Visit: Payer: BC Managed Care – PPO | Admitting: Internal Medicine

## 2010-06-02 DIAGNOSIS — Z0000 Encounter for general adult medical examination without abnormal findings: Secondary | ICD-10-CM

## 2010-06-02 DIAGNOSIS — E785 Hyperlipidemia, unspecified: Secondary | ICD-10-CM

## 2010-06-02 LAB — CBC WITH DIFFERENTIAL/PLATELET
Basophils Relative: 0.7 % (ref 0.0–3.0)
Lymphocytes Relative: 31.2 % (ref 12.0–46.0)
MCHC: 34.9 g/dL (ref 30.0–36.0)
MCV: 96.3 fl (ref 78.0–100.0)
Neutro Abs: 2.3 10*3/uL (ref 1.4–7.7)
Neutrophils Relative %: 56.2 % (ref 43.0–77.0)
Platelets: 234 10*3/uL (ref 150.0–400.0)
RBC: 3.82 Mil/uL — ABNORMAL LOW (ref 3.87–5.11)

## 2010-06-02 LAB — HEPATIC FUNCTION PANEL
Albumin: 4.1 g/dL (ref 3.5–5.2)
Total Protein: 6.4 g/dL (ref 6.0–8.3)

## 2010-06-02 LAB — POCT URINALYSIS DIPSTICK
Glucose, UA: NEGATIVE
Protein, UA: NEGATIVE

## 2010-06-02 LAB — BASIC METABOLIC PANEL
Chloride: 106 mEq/L (ref 96–112)
Potassium: 4.6 mEq/L (ref 3.5–5.1)

## 2010-06-02 LAB — LDL CHOLESTEROL, DIRECT: Direct LDL: 151.5 mg/dL

## 2010-06-02 LAB — LIPID PANEL
HDL: 53.6 mg/dL (ref >39.00)
Total CHOL/HDL Ratio: 4

## 2010-06-08 ENCOUNTER — Encounter: Payer: Self-pay | Admitting: Endocrinology

## 2010-06-08 ENCOUNTER — Ambulatory Visit (INDEPENDENT_AMBULATORY_CARE_PROVIDER_SITE_OTHER): Payer: BC Managed Care – PPO | Admitting: Internal Medicine

## 2010-06-08 ENCOUNTER — Ambulatory Visit (INDEPENDENT_AMBULATORY_CARE_PROVIDER_SITE_OTHER): Payer: BC Managed Care – PPO | Admitting: Endocrinology

## 2010-06-08 ENCOUNTER — Encounter: Payer: Self-pay | Admitting: Internal Medicine

## 2010-06-08 VITALS — BP 110/80 | HR 66 | Ht 60.5 in | Wt 155.0 lb

## 2010-06-08 DIAGNOSIS — E0590 Thyrotoxicosis, unspecified without thyrotoxic crisis or storm: Secondary | ICD-10-CM

## 2010-06-08 DIAGNOSIS — E781 Pure hyperglyceridemia: Secondary | ICD-10-CM

## 2010-06-08 DIAGNOSIS — E042 Nontoxic multinodular goiter: Secondary | ICD-10-CM

## 2010-06-08 DIAGNOSIS — Z0000 Encounter for general adult medical examination without abnormal findings: Secondary | ICD-10-CM

## 2010-06-08 DIAGNOSIS — D72819 Decreased white blood cell count, unspecified: Secondary | ICD-10-CM | POA: Insufficient documentation

## 2010-06-08 DIAGNOSIS — E890 Postprocedural hypothyroidism: Secondary | ICD-10-CM

## 2010-06-08 DIAGNOSIS — Z1211 Encounter for screening for malignant neoplasm of colon: Secondary | ICD-10-CM

## 2010-06-08 DIAGNOSIS — M899 Disorder of bone, unspecified: Secondary | ICD-10-CM

## 2010-06-08 DIAGNOSIS — E785 Hyperlipidemia, unspecified: Secondary | ICD-10-CM

## 2010-06-08 HISTORY — DX: Decreased white blood cell count, unspecified: D72.819

## 2010-06-08 NOTE — Assessment & Plan Note (Signed)
Due for colonoscopy   Check on zostavax  .   Recheck in a year  .Marland KitchenMarland KitchenMarland KitchenCounseled  Health care parameters.

## 2010-06-08 NOTE — Assessment & Plan Note (Signed)
Encourage life style.   intervention

## 2010-06-08 NOTE — Assessment & Plan Note (Signed)
Ca vit d in diet monitoring as appropriate for  prevntion

## 2010-06-08 NOTE — Assessment & Plan Note (Addendum)
Now with elevated tsh   And beginning on supplementation per Dr Everardo All

## 2010-06-08 NOTE — Progress Notes (Signed)
  Subjective:    Patient ID: Laura Brooks, female    DOB: [birth year], 60 y.o.   MRN: [id number]  HPI  Patient comes in for her yearly check. Since her last checkup she has done pretty well with no major changes in her health however she is To have hysterectomy and bladder rx in summer for her prolapsing   pelvic  Sx .   her thyroid is now stable. Sp  rai rx  And now on  Supplement for 2 days with elevated tsh in 7 range Review of Systems No fever weight change seats CP sob gi sx .    No injury fall  . limitation of exercise  Rest of ROS negative or noncontributory    Objective:   Physical Exam Physical Exam: Vital signs reviewed BJY:NWGN is a well-developed well-nourished alert cooperative  white female who appears her stated age in no acute distress.  HEENT: normocephalic  traumatic , Eyes: PERRL EOM's full, conjunctiva clear, Nares: paten,t no deformity discharge or tenderness., Ears: no deformity EAC's clear TMs with normal landmarks. Mouth: clear OP, no lesions, edema.  Moist mucous membranes. Dentition in adequate repair. NECK: supple without masses, thyroid palpable  or bruits. CHEST/PULM:  Clear to auscultation and percussion breath sounds equal no wheeze , rales or rhonchi. No chest wall deformities or tenderness. Breast: normal by inspection . No dimpling, discharge, masses, tenderness or discharge .  CV: PMI is nondisplaced, S1 S2 no gallops, murmurs, rubs. Peripheral pulses are full without delay.No JVD .  ABDOMEN: Bowel sounds normal nontender  No guard or rebound, no hepato splenomegal no CVA tenderness.  No hernia. GYNE: per gyne  Extremtities:  No clubbing cyanosis or edema, no acute joint swelling or redness no focal atrophy NEURO:  Oriented x3, cranial nerves 3-12 appear to be intact, no obvious focal weakness,gait within normal limits no abnormal reflexes or asymmetrical SKIN: No acute rashes normal turgor, color, no bruising or petechiae. PSYCH: Oriented, good eye contact,  no obvious depression anxiety, cognition and judgment appear normal. Labs reviewed        Assessment & Plan:  PV  UTD  But discussed zostavax  Reviewed  Dec wbc  prob insig  Or from recent thyroid rx.   Can recheck lab with next blood test.  Elevated Tg   Counseled.  Thyroid

## 2010-06-08 NOTE — Patient Instructions (Addendum)
Hypertriglyceridemia  Diet for High blood levels of Triglycerides   Most fats in food are triglycerides. Triglycerides in your blood are stored as fat in your body. High levels of triglycerides in your blood may put you at a greater risk for heart disease and stroke.    Normal triglyceride levels are less than 150 mg/dL. Borderline high levels are 150-199 mg/dl. High levels are 200 - 499 mg/dL, and very high triglyceride levels are greater than 500 mg/dL. The decision to treat high triglycerides is generally based on the level. For people with borderline or high triglyceride levels, treatment includes weight loss and exercise. Drugs are recommended for people with very high triglyceride levels.   Many people who need treatment for high triglyceride levels have metabolic syndrome. This syndrome is a collection of disorders that often include: insulin resistance, high blood pressure, blood clotting problems, high cholesterol and triglycerides.   TESTING PROCEDURE FOR TRIGLYCERIDES  You should not eat 4 hours before getting your triglycerides measured. The normal range of triglycerides is between 10 and 250 milligrams per deciliter (mg/dl). Some people may have extreme levels (1000 or above), but your triglyceride level may be too high if it is above 150 mg/dl, depending on what other risk factors you have for heart disease.   People with high blood triglycerides may also have high blood cholesterol levels. If you have high blood cholesterol as well as high blood triglycerides, your risk for heart disease is probably greater than if you only had high triglycerides. High blood cholesterol is one of the main risk factors for heart disease.    CHANGING YOUR DIET  Your weight can affect your blood triglyceride level. If you are more than 20% above your ideal body weight, you may be able to lower your blood triglycerides by losing weight. Eating less and exercising regularly is the best way to combat this.  Fat provides more calories than any other food. The best way to lose weight is to eat less fat. Only 30% of your total calories should come from fat. Less than 7% of your diet should come from saturated fat. A diet low in fat and saturated fat is the same as a diet to decrease blood cholesterol. By eating a diet lower in fat, you may lose weight, lower your blood cholesterol, and lower your blood triglyceride level.    Eating a diet low in fat, especially saturated fat, may also help you lower your blood triglyceride level. Ask your dietitian to help you figure how much fat you can eat based on the number of calories your caregiver has prescribed for you.    Exercise, in addition to helping with weight loss may also help lower triglyceride levels.   Alcohol can increase blood triglycerides. You may need to stop drinking alcoholic beverages.   Too much carbohydrate in your diet may also increase your blood triglycerides. Some complex carbohydrates are necessary in your diet. These may include bread, rice, potatoes, other starchy vegetables and cereals.   Reduce "simple" carbohydrates. These may include pure sugars, candy, honey, and jelly without losing other nutrients. If you have the kind of high blood triglycerides that is affected by the amount of carbohydrates in your diet, you will need to eat less sugar and less high-sugar foods. Your caregiver can help you with this.  Adding 2-4 grams of fish oil (EPA+ DHA) may also help lower triglycerides. Speak with your caregiver before adding any supplements to your regimen.    Following the  Diet  Maintain your ideal weight. Your caregivers can help you with a diet. Generally, eating less food and getting more exercise will help you lose weight. Joining a weight control group may also help. Ask your caregivers for a good weight control group in your area.    Eat low-fat foods instead of high-fat foods. This can help you lose weight too.  These foods are  lower in fat. Eat MORE of these:   Dried beans, peas, and lentils.   Egg whites.  Low-fat cottage cheese.   Fish.   Lean cuts of meat, such as round, sirloin, rump, and flank (cut extra fat off meat you fix).  Whole grain breads, cereals and pasta.   Skim and nonfat dry milk.   Low-fat yogurt.   Poultry without the skin.   Cheese made with skim or part-skim milk, such as mozzarella, parmesan, farmers', ricotta, or pot cheese.     These are higher fat foods. Eat LESS of these:   Whole milk and foods made from whole milk, such as American, blue, cheddar, monterey jack, and swiss cheese  High-fat meats, such as luncheon meats, sausages, knockwurst, bratwurst, hot dogs, ribs, corned beef, ground pork, and regular ground beef.   Fried foods.   Limit saturated fats in your diet. Substituting unsaturated fat for saturated fat may decrease your blood triglyceride level. You will need to read package labels to know which products contain saturated fats.  These foods are high in saturated fat. Eat LESS of these:   Fried pork skins.  Whole milk.  Skin and fat from poultry.  Palm oil.  Butter.  Shortening.  Cream cheese.  Tomasa Blase.  Margarines and baked goods made from listed oils.  Vegetable shortenings.  Chitterlings.  Fat from meats.  Coconut oil.  Palm kernel oil.  Lard.  Cream.  Sour cream.  Fatback.  Coffee whiteners and non-dairy creamers made with these oils.  Cheese made from whole milk.    Use unsaturated fats (both polyunsaturated and monounsaturated) moderately. Remember, even though unsaturated fats are better than saturated fats; you still want a diet low in total fat.  These foods are high in unsaturated fat:   Canola oil.  Sunflower oil.  Mayonnaise.  Almonds.  Peanuts.  Pine nuts.  Margarines made with these oils.  Safflower oil.  Olive oil.  Avocados.  Cashews.  Peanut butter.  Sunflower seeds.    Soybean oil.   Peanut oil.  Olives.  Pecans.  Walnuts.  Pumpkin seeds.    Avoid sugar and other high-sugar foods. This will decrease carbohydrates without decreasing other nutrients. Sugar in your food goes rapidly to your blood. When there is excess sugar in your blood, your liver may use it to make more triglycerides. Sugar also contains calories without other important nutrients.    Eat LESS of these:   Sugar, brown sugar, powdered sugar, jam, jelly, preserves, honey, syrup, molasses, pies, candy, cakes, cookies, frosting, pastries, colas, soft drinks, punches, fruit drinks, and regular gelatin.   Avoid alcohol. Alcohol, even more than sugar, may increase blood triglycerides. In addition, alcohol is high in calories and low in nutrients. Ask for sparkling water, or a diet soft drink instead of an alcoholic beverage.    Suggestions for planning and preparing meals   Bake, broil, grill or roast meats instead of frying.  Remove fat from meats and skin from poultry before cooking.   Add spices, herbs, lemon juice or vinegar to vegetables instead of salt, rich  sauces or gravies.   Use a non-stick skillet without fat or use no-stick sprays.   Cool and refrigerate stews and broth. Then remove the hardened fat floating on the surface before serving.   Refrigerate meat drippings and skim off fat to make low-fat gravies.   Serve more fish.   Use less butter, margarine and other high-fat spreads on bread or vegetables.   Use skim or reconstituted non-fat dry milk for cooking.   Cook with low-fat cheeses.   Substitute low-fat yogurt or cottage cheese for all or part of the sour cream in recipes for sauces, dips or congealed salads.   Use half yogurt/half mayonnaise in salad recipes.   Substitute evaporated skim milk for cream. Evaporated skim milk or reconstituted non-fat dry milk can be whipped and substituted for whipped cream in certain recipes.   Choose fresh fruits for dessert instead of  high-fat foods such as pies or cakes. Fruits are naturally low in fat.   When Dining Out   Order low-fat appetizers such as fruit or vegetable juice, pasta with vegetables or tomato sauce.   Select clear, rather than cream soups.   Ask that dressings and gravies be served on the side. Then use less of them.   Order foods that are baked, broiled, poached, steamed, stir-fried, or roasted.   Ask for margarine instead of butter, and use only a small amount.   Drink sparkling water, unsweetened tea or coffee, or diet soft drinks instead of alcohol or other sweet beverages.    QUESTIONS AND ANSWERS ABOUT OTHER FATS IN THE BLOOD:  SATURATED FAT, TRANS FAT, AND CHOLESTEROL   What is trans fat? Trans fat is a type of fat that is formed when vegetable oil is hardened through a process called hydrogenation. This process helps makes foods more solid, gives them shape, and prolongs their shelf life. Trans fats are also called hydrogenated or partially hydrogenated oils.    What do saturated fat, trans fat, and cholesterol in foods have to do with heart disease? Saturated fat, trans fat, and cholesterol in the diet all raise the level of LDL "bad" cholesterol in the blood. The higher the LDL cholesterol, the greater the risk for coronary heart disease (CHD). Saturated fat and trans fat raise LDL similarly.    What foods contain saturated fat, trans fat, and cholesterol? High amounts of saturated fat are found in animal products, such as fatty cuts of meat, chicken skin, and full-fat dairy products like butter, whole milk, cream, and cheese, and in tropical vegetable oils such as palm, palm kernel, and coconut oil. Trans fat is found in some of the same foods as saturated fat, such as vegetable shortening, some margarines (especially hard or stick margarine), crackers, cookies, baked goods, fried foods, salad dressings, and other processed foods made with partially hydrogenated vegetable oils. Small  amounts of trans fat also occur naturally in some animal products, such as milk products, beef, and lamb. Foods high in cholesterol include liver, other organ meats, egg yolks, shrimp, and full-fat dairy products.   How can I use the new food label to make heart-healthy food choices? Check the Nutrition Facts panel of the food label. Choose foods lower in saturated fat, trans fat, and cholesterol. For saturated fat and cholesterol, you can also use the Percent Daily Value (%DV): 5% DV or less is low, and 20% DV or more is high. (There is no %DV for trans fat.) Use the Nutrition Facts panel to choose foods low  in saturated fat and cholesterol, and if the trans fat is not listed, read the ingredients and limit products that list shortening or hydrogenated or partially hydrogenated vegetable oil, which tend to be high in trans fat.   POINTS TO REMEMBER: YOU NEED A LITTLE TLC (THERAPEUTIC LIFESTYLE CHANGES)  Discuss your risk for heart disease with your caregivers, and take steps to reduce risk factors.  Change your diet. Choose foods that are low in saturated fat, trans fat, and cholesterol.  Add exercise to your daily routine if it is not already being done. Participate in physical activity of moderate intensity, like brisk walking, for at least 30 minutes on most, and preferably all days of the week. No time? Break the 30 minutes into three, 10-minute segments during the day.  Stop smoking. If you do smoke, contact your caregiver to discuss ways in which they can help you quit.  Do not use street drugs.  Maintain a normal weight.  Maintain a healthy blood pressure.  Keep up with your blood work for checking the fats in your blood as directed by your caregiver.   Most of this information is courtesy of the [facility].    [employer] Patient Information 2011 [place], [place].

## 2010-06-10 ENCOUNTER — Encounter: Payer: Self-pay | Admitting: Internal Medicine

## 2010-06-16 ENCOUNTER — Telehealth: Payer: Self-pay | Admitting: Endocrinology

## 2010-06-16 NOTE — Assessment & Plan Note (Signed)
Summary: 4-6 WK FU--STC   Vital Signs:  Patient profile:   60 year old female Menstrual status:  postmenopausal Height:      61 inches (154.94 cm) Weight:      154.50 pounds (70.23 kg) BMI:     29.30 O2 Sat:      97 % on Room air Temp:     97.9 degrees F (36.61 degrees C) oral Pulse rate:   76 / minute Pulse rhythm:   regular BP sitting:   112 / 72  (left arm) Cuff size:   regular  Vitals Entered By: Brenton Grills CMA (AAMA) (June 08, 2010 8:08 AM)  O2 Flow:  Room air CC: 4-6 week F/U/aj Is Patient Diabetic? Yes   CC:  4-6 week F/U/aj.  History of Present Illness: pt is now 3 mos s/p i-131 rx for hyperthyroidism, due to multinodular goiter.  pt states she feels well in general.  she does not notice the goiter.  Current Medications (verified): 1)  Valtrex 1 Gm Tabs (Valacyclovir Hcl) .... As Needed 2)  Vitamin D3 1000 Unit Tabs (Cholecalciferol) .... Take 3)  Premarin 0.625 Mg/gm Crea (Estrogens, Conjugated)  Allergies (verified): No Known Drug Allergies  Past History:  Past Medical History: Last updated: 06/02/2009 Osteopenia g2p2 microscopic hematuria  had evaluation bone health Radiculopathy     l4-5  Consults: Dr. Nilsa Nutting   Ortho        LAST Mammogram: 2009 Pap: scheduled for 05/29/2008 Td: 09/1998 Colonscopy: 2001 EKG: 05/11/2006 Dexa: 2009 Eye Exam: 2009 Other: Smoking: former  Review of Systems       she has lost a few lbs, due to her efforts  Physical Exam  General:  normal appearance.   Neck:  minimal multinodular goiter, right > left.   Impression & Recommendations:  Problem # 1:  HYPOTHYROIDISM, POST-RADIATION (ICD-244.1) Assessment New  Problem # 2:  GOITER, MULTINODULAR (ICD-241.1) Assessment: Improved  Medications Added to Medication List This Visit: 1)  Levothyroxine Sodium 50 Mcg Tabs (Levothyroxine sodium) .Marland Kitchen.. 1 tab once daily  Other Orders: Est. Patient Level III (21308)  Patient Instructions: 1)  start  levothyroxine 50 micrograms/day. 2)  Please schedule a follow-up appointment in 1 month. Prescriptions: LEVOTHYROXINE SODIUM 50 MCG TABS (LEVOTHYROXINE SODIUM) 1 tab once daily  #30 x 2   Entered and Authorized by:   Minus Breeding MD   Signed by:   Minus Breeding MD on 06/08/2010   Method used:   Electronically to        [facility]  [phone]* (retail)       [address]       [place], [place]  [address]       Ph: [phone] or [phone]       Fax: [phone]   RxID:   [id number]    Orders Added: 1)  Est. Patient Level III [41660]

## 2010-06-21 NOTE — Progress Notes (Signed)
Summary: Synthroid SE?  Phone Note Call from Patient Call back at Home Phone [phone]   Caller: Patient VM OK Summary of Call: Pt called stating she believes she is having SE from synthroid. Pt states she is having a tingling sensation all over her body and some mild muscle spasms of her hand and fingers. Initial call taken by: Margaret Pyle, CMA,  June 16, 2010 11:19 AM  Follow-up for Phone Call        if you are having se, they would be reflected on labs.  i am happy to request labs--you can just come in for them.  do you want to do? Follow-up by: Minus Breeding MD,  June 16, 2010 11:25 AM  Additional Follow-up for Phone Call Additional follow up Details #1::        Pt states she would like to have labs done tomorrow morning, 03/09. Can pt take synthroid before labs? Additional Follow-up by: Margaret Pyle, CMA,  June 16, 2010 3:30 PM    Additional Follow-up for Phone Call Additional follow up Details #2::    yes Follow-up by: Minus Breeding MD,  June 16, 2010 4:02 PM  Additional Follow-up for Phone Call Additional follow up Details #3:: Details for Additional Follow-up Action Taken: Pt advised, Dx and test name? Margaret Pyle, CMA  June 16, 2010 4:21 PM  tsh 244.1  Pt declined to have labs done, stating she is feeing fine today. Pt will call back if needed to have labs scheduled. Margaret Pyle, CMA  June 17, 2010 8:48 AM  Additional Follow-up by: Minus Breeding MD,  June 16, 2010 5:03 PM

## 2010-07-08 ENCOUNTER — Ambulatory Visit (INDEPENDENT_AMBULATORY_CARE_PROVIDER_SITE_OTHER): Payer: BC Managed Care – PPO | Admitting: Endocrinology

## 2010-07-08 ENCOUNTER — Encounter: Payer: Self-pay | Admitting: Endocrinology

## 2010-07-08 DIAGNOSIS — E890 Postprocedural hypothyroidism: Secondary | ICD-10-CM

## 2010-07-08 DIAGNOSIS — D72819 Decreased white blood cell count, unspecified: Secondary | ICD-10-CM

## 2010-07-08 NOTE — Patient Instructions (Signed)
blood tests are being ordered for you today.  please call [phone] to hear your test results. pending the test results, please continue the same medications for now. Return here in 6-8 weeks.

## 2010-07-08 NOTE — Progress Notes (Signed)
  Subjective:    Patient ID: Laura Brooks, female    DOB: 12-Feb-1951, 60 y.o.   MRN: [id number]  HPI pt is now 4 mos s/p i-131 rx for hyperthyroidism, due to multinodular goiter.  pt states she feels well in general, except for  Slight headach.  she does not notice the goiter.  No fever. Past Medical History  Diagnosis Date  . Osteopenia     dexa 2009   . Radiculopathy, lumbar region     L4L5  . Benign microscopic hematuria   . Thyroid disease     hyperthyroid RAI 11  2011   Past Surgical History  Procedure Date  . Tonsillectomy     reports that she has quit smoking. She does not have any smokeless tobacco history on file. She reports that she does not drink alcohol or use illicit drugs. family history includes Anemia in her father and Thyroid disease in her sister. No Known Allergies  Review of Systems No weight change    Objective:   Physical Exam General:  normal appearance.   Neck:  minimal multinodular goiter, right > left.       Assessment & Plan:  Multinodular goiter, no change on exam Post-i-131 hypothyroidism, needs recheck

## 2010-08-11 ENCOUNTER — Ambulatory Visit (AMBULATORY_SURGERY_CENTER): Payer: BC Managed Care – PPO | Admitting: *Deleted

## 2010-08-11 VITALS — Ht 61.0 in | Wt 151.0 lb

## 2010-08-11 DIAGNOSIS — Z1211 Encounter for screening for malignant neoplasm of colon: Secondary | ICD-10-CM

## 2010-08-11 MED ORDER — PEG-KCL-NACL-NASULF-NA ASC-C 100 G PO SOLR: ORAL | Status: DC

## 2010-08-15 ENCOUNTER — Telehealth: Payer: Self-pay | Admitting: Internal Medicine

## 2010-08-15 NOTE — Telephone Encounter (Signed)
Prep called in to [facility] on [address].  Pt notified.  Ezra Sites

## 2010-08-19 ENCOUNTER — Ambulatory Visit: Payer: BC Managed Care – PPO | Admitting: Endocrinology

## 2010-08-25 ENCOUNTER — Other Ambulatory Visit: Payer: BC Managed Care – PPO | Admitting: Internal Medicine

## 2010-08-25 ENCOUNTER — Ambulatory Visit (AMBULATORY_SURGERY_CENTER): Payer: BC Managed Care – PPO | Admitting: Internal Medicine

## 2010-08-25 ENCOUNTER — Encounter: Payer: Self-pay | Admitting: Internal Medicine

## 2010-08-25 VITALS — BP 112/53 | HR 62 | Temp 98.0°F | Resp 16 | Ht 61.0 in | Wt 150.0 lb

## 2010-08-25 DIAGNOSIS — Z1211 Encounter for screening for malignant neoplasm of colon: Secondary | ICD-10-CM

## 2010-08-25 LAB — HM COLONOSCOPY

## 2010-08-25 MED ORDER — SODIUM CHLORIDE 0.9 % IV SOLN: 500.0000 mL | INTRAVENOUS | Status: DC

## 2010-08-25 NOTE — Patient Instructions (Signed)
Follow discharge instructions.  Continue your medications.   Next Colonoscopy in 10 years. 

## 2010-08-25 NOTE — Progress Notes (Signed)
Patient seen by Dr. Juanda Chance, Zofran 4 mg po sl ordered and given. Pt. Alert, skin warm dry pink , abd soft no vomitus. 1132 Patient alert oriented and improved. Patient wants to go home. Discharged to home by wc. Husband and patient agreeable and want dc.

## 2010-08-26 ENCOUNTER — Telehealth: Payer: Self-pay | Admitting: *Deleted

## 2010-08-26 NOTE — Telephone Encounter (Signed)

## 2010-08-31 ENCOUNTER — Other Ambulatory Visit (INDEPENDENT_AMBULATORY_CARE_PROVIDER_SITE_OTHER): Payer: BC Managed Care – PPO

## 2010-08-31 ENCOUNTER — Encounter: Payer: Self-pay | Admitting: Endocrinology

## 2010-08-31 ENCOUNTER — Ambulatory Visit (INDEPENDENT_AMBULATORY_CARE_PROVIDER_SITE_OTHER): Payer: BC Managed Care – PPO | Admitting: Endocrinology

## 2010-08-31 VITALS — BP 122/78 | HR 73 | Temp 98.3°F | Ht 61.0 in | Wt 154.4 lb

## 2010-08-31 DIAGNOSIS — E890 Postprocedural hypothyroidism: Secondary | ICD-10-CM

## 2010-08-31 DIAGNOSIS — D72819 Decreased white blood cell count, unspecified: Secondary | ICD-10-CM

## 2010-08-31 LAB — CBC WITH DIFFERENTIAL/PLATELET
Eosinophils Relative: 2.2 % (ref 0.0–5.0)
HCT: 36.7 % (ref 36.0–46.0)
Hemoglobin: 12.6 g/dL (ref 12.0–15.0)
Lymphs Abs: 1.3 10*3/uL (ref 0.7–4.0)
MCV: 97.9 fl (ref 78.0–100.0)
Monocytes Absolute: 0.5 10*3/uL (ref 0.1–1.0)
Monocytes Relative: 8.7 % (ref 3.0–12.0)
Neutro Abs: 3.3 10*3/uL (ref 1.4–7.7)
Platelets: 233 10*3/uL (ref 150.0–400.0)
WBC: 5.2 10*3/uL (ref 4.5–10.5)

## 2010-08-31 LAB — TSH: TSH: 4.59 u[IU]/mL (ref 0.35–5.50)

## 2010-08-31 MED ORDER — LEVOTHYROXINE SODIUM 75 MCG PO CAPS: 1.0000 | ORAL_CAPSULE | Freq: Every day | ORAL | Status: DC

## 2010-08-31 NOTE — Patient Instructions (Addendum)
blood tests are being ordered for you today.  please call [phone] to hear your test results.  You will be prompted to enter the 9-digit "MRN" number that appears at the top left of this page, followed by #.  Then you will hear the message. pending the test results, please continue the same medications for now. Return here in 4 months. (update: i left message on phone-tree:  Increase synthroid to 75/d).  You are medically cleared form the standpoint of the thyroid.

## 2010-08-31 NOTE — Progress Notes (Signed)
  Subjective:    Patient ID: Laura Brooks, female    DOB: 16-May-1950, 60 y.o.   MRN: [id number]  HPI pt is now 6 mos s/p i-131 rx for hyperthyroidism, due to multinodular goiter. pt states she feels well in general, except for intermittent mild headache. she does not notice the goiter.     Review of Systems Denies fever.      Objective:   Physical Exam GENERAL: no distress Neck:   Thyroid is minimally enlarged, right > left.  No nodule.       Assessment & Plan:  Multinodular goiter, improved Post-i-131 hypothyroidism, needs increased rx

## 2010-09-01 ENCOUNTER — Other Ambulatory Visit: Payer: Self-pay

## 2010-09-01 MED ORDER — LEVOTHYROXINE SODIUM 75 MCG PO CAPS: 1.0000 | ORAL_CAPSULE | Freq: Every day | ORAL | Status: DC

## 2010-09-02 ENCOUNTER — Other Ambulatory Visit: Payer: BC Managed Care – PPO | Admitting: Internal Medicine

## 2010-09-08 ENCOUNTER — Telehealth: Payer: Self-pay | Admitting: *Deleted

## 2010-09-08 NOTE — Telephone Encounter (Signed)
Pt calling requesting information on if she would benefit from getting shingles vaccine

## 2010-09-09 HISTORY — PX: BLADDER SUSPENSION: SHX72

## 2010-09-09 NOTE — Telephone Encounter (Signed)
Pt aware of this. She would like info put in the mail about this. I mailed her the info on zostavax.

## 2010-09-09 NOTE — Telephone Encounter (Signed)
Left message to call back  

## 2010-09-13 ENCOUNTER — Other Ambulatory Visit: Payer: Self-pay | Admitting: Obstetrics and Gynecology

## 2010-09-13 ENCOUNTER — Other Ambulatory Visit (HOSPITAL_COMMUNITY): Payer: BC Managed Care – PPO

## 2010-09-13 ENCOUNTER — Encounter (HOSPITAL_COMMUNITY): Payer: BC Managed Care – PPO

## 2010-09-13 LAB — COMPREHENSIVE METABOLIC PANEL
AST: 18 U/L (ref 0–37)
CO2: 28 mEq/L (ref 19–32)
Calcium: 9.1 mg/dL (ref 8.4–10.5)
Creatinine, Ser: 0.78 mg/dL (ref 0.4–1.2)
GFR calc Af Amer: >60 mL/min (ref >60)
GFR calc non Af Amer: >60 mL/min (ref >60)

## 2010-09-13 LAB — SURGICAL PCR SCREEN
MRSA, PCR: NEGATIVE
Staphylococcus aureus: POSITIVE — AB

## 2010-09-13 LAB — CBC
HCT: 37.5 % (ref 36.0–46.0)
MCH: 32.8 pg (ref 26.0–34.0)
MCHC: 34.7 g/dL (ref 30.0–36.0)
MCV: 94.7 fL (ref 78.0–100.0)
Platelets: 222 10*3/uL (ref 150–400)
RDW: 12.1 % (ref 11.5–15.5)
WBC: 4.6 10*3/uL (ref 4.0–10.5)

## 2010-09-20 ENCOUNTER — Ambulatory Visit (HOSPITAL_COMMUNITY)
Admission: RE | Admit: 2010-09-20 | Discharge: 2010-09-21 | Disposition: A | Discharge status: Home / Self Care | Payer: BC Managed Care – PPO | Source: Ambulatory Visit | Attending: Obstetrics and Gynecology | Admitting: Obstetrics and Gynecology

## 2010-09-20 ENCOUNTER — Other Ambulatory Visit: Payer: Self-pay | Admitting: Obstetrics and Gynecology

## 2010-09-20 DIAGNOSIS — N812 Incomplete uterovaginal prolapse: Secondary | ICD-10-CM | POA: Insufficient documentation

## 2010-09-20 DIAGNOSIS — D251 Intramural leiomyoma of uterus: Secondary | ICD-10-CM | POA: Insufficient documentation

## 2010-09-20 DIAGNOSIS — D250 Submucous leiomyoma of uterus: Secondary | ICD-10-CM | POA: Insufficient documentation

## 2010-09-20 DIAGNOSIS — D252 Subserosal leiomyoma of uterus: Secondary | ICD-10-CM | POA: Insufficient documentation

## 2010-09-20 LAB — PROTIME-INR: Prothrombin Time: 14.2 seconds (ref 11.6–15.2)

## 2010-09-20 LAB — CBC
MCH: 32.4 pg (ref 26.0–34.0)
MCHC: 34.8 g/dL (ref 30.0–36.0)
MCV: 93.1 fL (ref 78.0–100.0)
Platelets: 183 10*3/uL (ref 150–400)
RBC: 3.49 MIL/uL — ABNORMAL LOW (ref 3.87–5.11)
RDW: 11.9 % (ref 11.5–15.5)

## 2010-09-20 LAB — BASIC METABOLIC PANEL
CO2: 25 mEq/L (ref 19–32)
Calcium: 8.6 mg/dL (ref 8.4–10.5)
Creatinine, Ser: 0.68 mg/dL (ref 0.4–1.2)
GFR calc non Af Amer: >60 mL/min (ref >60)
Sodium: 139 mEq/L (ref 135–145)

## 2010-09-20 LAB — TYPE AND SCREEN
ABO/RH(D): A POS
Antibody Screen: NEGATIVE

## 2010-09-21 LAB — BASIC METABOLIC PANEL
Calcium: 8.3 mg/dL — ABNORMAL LOW (ref 8.4–10.5)
Chloride: 101 mEq/L (ref 96–112)
Creatinine, Ser: 0.7 mg/dL (ref 0.4–1.2)
GFR calc Af Amer: >60 mL/min (ref >60)
Sodium: 136 mEq/L (ref 135–145)

## 2010-09-21 LAB — CBC
MCV: 92.8 fL (ref 78.0–100.0)
Platelets: 195 10*3/uL (ref 150–400)
RBC: 3.33 MIL/uL — ABNORMAL LOW (ref 3.87–5.11)
RDW: 12.1 % (ref 11.5–15.5)
WBC: 15 10*3/uL — ABNORMAL HIGH (ref 4.0–10.5)

## 2010-09-27 NOTE — Op Note (Signed)
NAMESHAMONE, WINZER                 ACCOUNT NO.:  [id number]  MEDICAL RECORD NO.:  [id number]  LOCATION:  9304                          FACILITY:  [facility]  PHYSICIAN:  Martina Sinner, MD DATE OF BIRTH:  March 08, 1951  DATE OF PROCEDURE: DATE OF DISCHARGE:                              OPERATIVE REPORT   SURGEON:  Martina Sinner, MD  ASSISTANT:  Delia Chimes, NP  PREOPERATIVE DIAGNOSIS:  Uterine vault prolapse, cystocele and rectocele surgery, transvaginal vault suspension plus cystocele repair plus graft plus rectocele repair plus perineal repair plus cystoscopy.  FINDINGS AND PROCEDURE:  [name] underwent a transvaginal hysterectomy by Dr. Tresa Res and her team.  Vaginal cuff was opened when I entered the room and her posterior cuff was run.  She had the above findings.  Preoperative antibiotics were normal.  She was in lithotomy position.  I instilled approximately 20 mL of lidocaine-epinephrine mixture anteriorly.  I used my Allis technique to make an anterior T-shaped incision and sharply dissected the overlying vaginal mucosa from the underlying pubocervical fascia to the white line bilaterally.  I was careful at the level of the ureterosacral ligaments that were tagged, not to cut the tag sutures.  I mobilized nicely at the dome.  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  She had a mild central defect but her vaginal cuff really came up quite far.  I did not imbricate the posterior dome of her bladder and pushed up nicely with my anterior repair.  I cystoscoped the patient that took down my double-ring retractor.  She had cystoscopically a good repair of her cystocele and excellent blue jets bilaterally.  I finger dissected the ischial spine bilaterally.  I swept away appropriate soft tissues.  I placed a 0 Ethibond one fingerbreadth medial to the ischial spine bilaterally and a straight  line between two spines.  I was very happy with position of the sutures.  I did a rectal examination.  There is no suture in the rectum.  There is no rectal injury.  At the level of the urethrovesical angle and the pelvic sidewall, I placed a 0 Vicryl suture on a UR-6 needle.  I tapered a 10 x 6 dermal graft in shape of a trapezoid and sewed it in place and it fit in very nicely and the graft covered the apex very well.  I trimmed appropriate amount of anterior vaginal wall mucosa bilaterally and closed the anterior vaginal wall with running 2-0 Vicryl on a CT-1 needle, leaving the cuff open.  Because she had a little bit of a weak apex posteriorly, I did not cut my Ethibond and placed them through a thin lip of posterior vaginal wall and tied an air knot bilaterally, not under tension, pulling up the posterior apex.  I then closed the vaginal cuff with 0 Vicryl and I did tied it to ureterosacral sutures together.  There was excellent reduction of the apex with good lengthening and excellent repair of the anterior defect.  I did a rectal examination again.  There is no question she had a diffuse posterior defect.  I placed an Allis clamp on the 5 and 7 o'clock at the hymenal ring.  I removed a small triangle of perineal skin.  I made a long midline incision using my Allis technique and took it up to approximately 1.5 cm from the closed apex.  I sharply and bluntly dissected the thin posterior vaginal wall from the underlying rectovaginal fascia.  I repeated the rectal examination.  She really had diffuse weakness and not a site defect.  A two-layer posterior repair was done with 2-0 Vicryl on SH needle.  I repeated the rectal examination and I was very pleased that there was no suture in the rectum.  I trimmed a minimal amount of the posterior vaginal wall and closed the posterior vaginal wall incision with running 2-0 Vicryl on CT-1 needle. I used a 0 Vicryl with a gentle  imbricating one suture on the perineal body.  I exteriorized the running 2-0 Vicryl at the level of the introitus and did a subcuticular incision to close the perineum.  She had excellent length.  She had very good repair posterior anteriorly.  Bleeding was less than 100 to 150 mL.  Leg position was good.  She had good urine output with blue-colored urine throughout the case.  Hopefully, this operation will reach her treatment goal.          ______________________________ Martina Sinner, MD     SAM/MEDQ  D:  09/20/2010  T:  09/20/2010  Job:  [id number]  Electronically Signed by Alfredo Martinez MD on 09/27/2010 09:09:36 AM

## 2010-10-28 NOTE — Op Note (Signed)
  NAMELASHIA, Laura Brooks                 ACCOUNT NO.:  [id number]  MEDICAL RECORD NO.:  [id number]  LOCATION:  9304                          FACILITY:  [facility]  PHYSICIAN:  Ashutosh Dieguez P. Saud Bail, M.D.DATE OF BIRTH:  07-Oct-1950  DATE OF PROCEDURE:  09/20/2010 DATE OF DISCHARGE:                              OPERATIVE REPORT   PREOPERATIVE DIAGNOSIS:  Pelvic prolapse.  POSTOPERATIVE DIAGNOSIS:  Pelvic prolapse, path pending.  PROCEDURE:  Total vaginal hysterectomy by Dr. Meredeth Ide, pelvic prolapse repair by Dr. Lorin Picket MacDiarmid following.  Assistant for Dr. Tresa Res was Dr. Leda Quail.  ANESTHESIA:  General endotracheal.  Estimated blood loss for the vaginal hysterectomy less than 100 mL.  COMPLICATIONS:  None.  PROCEDURE:  The patient was taken to the operating room and after induction of adequate general endotracheal anesthesia, she was placed in a dorsal lithotomy position and prepped and draped in the usual fashion. A Foley catheter was inserted.  A posterior weighted and anterior Sims retractor were placed.  The cervix was grasped on anterior lip with a Jacobs tenaculum.  The mucosa over the cervix was infiltrated with 1% Xylocaine with epinephrine for a total of approximately 8 mL.  The knife was used to incise over the mucosa from 9 to 3 o'clock.  The mucosa was dissected off the cervix using sharp and blunt dissection.  The anterior peritoneum was visualized,elevated with hemostats,and entered atraumatically.  Attention was then turned posteriorly.  A posterior colpotomy incision was made and a posterior weighted retractor was placed into the posterior peritoneum.  Using Heaney clamp, the uterosacral ligaments were clamped, cut, and tied on each side using 0- Vicryl.  The hysterectomy continued up the cardinal ligament clamping, cutting, and tying in sequence.  The uterine arteries were clamped, cut, and doubly tied.  The procedure continued up the broad ligament to  the pedicles containing the tube, the utero-ovarian ligament, and the round ligament.  These were clamped, cut, and also doubly tied.  The specimen was sent to Pathology.  The pedicles were all inspected.  The adnexal pedicles were hemostatic.  The left ovary could be seen and was normal. The right ovary was so far cephalad, it could not be seen, however, the patient has had an ultrasound before surgery that revealed both ovaries to be normal.  The cuff was then run from 3 to 9 o'clock using a running suture of 0-Vicryl incorporating the vaginal mucosa and the peritoneum. Good hemostasis was then noted. The procedure was then turned over to Dr. Lorin Picket MacDiarmid who was in the operating room to continue with the repair and he will dictate that note separately.  There were no complications to the vaginal hysterectomy.  Sponge, needle, and instrument counts were correct.     Jacobie Stamey P. Yakub Lodes, M.D.     CPR/MEDQ  D:  09/20/2010  T:  09/21/2010  Job:  [id number]  cc:   Martina Sinner, MD Fax: [phone]  Electronically Signed by Meredeth Ide M.D. on 10/28/2010 01:30:23 PM

## 2010-12-29 ENCOUNTER — Other Ambulatory Visit (INDEPENDENT_AMBULATORY_CARE_PROVIDER_SITE_OTHER): Payer: BC Managed Care – PPO

## 2010-12-29 ENCOUNTER — Ambulatory Visit (INDEPENDENT_AMBULATORY_CARE_PROVIDER_SITE_OTHER): Payer: BC Managed Care – PPO | Admitting: Endocrinology

## 2010-12-29 ENCOUNTER — Encounter: Payer: Self-pay | Admitting: Endocrinology

## 2010-12-29 VITALS — BP 104/70 | HR 70 | Temp 98.0°F | Ht 63.0 in | Wt 151.2 lb

## 2010-12-29 DIAGNOSIS — E890 Postprocedural hypothyroidism: Secondary | ICD-10-CM

## 2010-12-29 LAB — TSH: TSH: 2.1 u[IU]/mL (ref 0.35–5.50)

## 2010-12-29 NOTE — Patient Instructions (Addendum)
blood tests are being ordered for you today.  please call [phone] to hear your test results.  You will be prompted to enter the 9-digit "MRN" number that appears at the top left of this page, followed by #.  Then you will hear the message. pending the test results, please continue the same medications for now. Return here in 6 months.   most of the time, a "lumpy thyroid" will eventually become overactive again.  this is usually a slow process, happening over the span of many years. (update: i left message on phone-tree:  Same rx)

## 2010-12-29 NOTE — Progress Notes (Signed)
Subjective:    Patient ID: Laura Brooks, female    DOB: [birth year], 60 y.o.   MRN: [id number]  HPI pt is now 10 mos s/p i-131 rx for hyperthyroidism, due to multinodular goiter. pt states she feels well in general. Since last ov, she has had a successful tah.  she does not notice the goiter.   Past Medical History  Diagnosis Date  . Osteopenia     dexa 2009   . Radiculopathy, lumbar region     L4L5  . Benign microscopic hematuria   . Thyroid disease     hyperthyroid RAI 11  2011    Past Surgical History  Procedure Date  . Tonsillectomy   . Colonoscopy     History   Social History  . Marital Status: Divorced    Spouse Name: N/A    Number of Children: N/A  . Years of Education: N/A   Occupational History  . [employer]    Social History Main Topics  . Smoking status: Former Games developer  . Smokeless tobacco: Never Used  . Alcohol Use: 0.5 oz/week    1 drink(s) per week  . Drug Use: No  . Sexually Active: Not on file   Other Topics Concern  . Not on file   Social History Narrative   Divorced former [employer]  Pet fish and parrot.    Current Outpatient Prescriptions on File Prior to Visit  Medication Sig Dispense Refill  . Acetylcarnitine HCl (ACETYL L-CARNITINE) 250 MG CAPS Take 1 capsule by mouth daily.        . Ascorbic Acid (VITAMIN C) 1000 MG tablet Take 1,000 mg by mouth daily.        . Calcium Carbonate-Vitamin D (CALCIUM + D) 600-200 MG-UNIT TABS Take 1 capsule by mouth daily.        Marland Kitchen CRANBERRY EXTRACT PO Take 4,200 mg by mouth 2 (two) times daily.        . Cyanocobalamin (VITAMIN B 12 PO) Take 250 mg by mouth daily.        . fish oil-omega-3 fatty acids 1000 MG capsule Take 1 g by mouth daily.        . Levothyroxine Sodium 75 MCG CAPS Take 1 capsule (75 mcg total) by mouth daily.  30 capsule  11  . Magnesium 100 MG CAPS Take 1 capsule by mouth daily.        . Multiple Vitamins-Minerals (MULTIVITAMIN WITH MINERALS)  tablet Take 1 tablet by mouth daily.        . peg 3350 powder (MOVIPREP) 100 G SOLR MOVI PREP take as directed  1 kit  0  . PREMARIN vaginal cream Place 1 Applicatorful vaginally 3 (three) times a week.      . valACYclovir (VALTREX) 1000 MG tablet as needed.        Current Facility-Administered Medications on File Prior to Visit  Medication Dose Route Frequency Provider Last Rate Last Dose  . 0.9 %  sodium chloride infusion  500 mL Intravenous Continuous Hart Carwin, MD        No Known Allergies  Family History  Problem Relation Age of Onset  . Anemia Father   . Thyroid disease Sister    BP 104/70  Pulse 70  Temp(Src) 98 F (36.7 C) (Oral)  Ht 5\' 3"  (1.6 m)  Wt 151 lb 3.2 oz (68.584 kg)  BMI 26.78 kg/m2  SpO2 98%  Review of Systems Denies fever  Objective:   Physical Exam VITAL SIGNS:  See vs page GENERAL: no distress Neck: There is no palpable thyroid enlargement.  No thyroid nodule is palpable.  No palpable lymphadenopathy at the anterior neck.  Lab Results  Component Value Date   TSH 2.10 12/29/2010      Assessment & Plan:  Post-i-131 hypothyroidism, well-replaced

## 2011-01-25 ENCOUNTER — Ambulatory Visit: Payer: BC Managed Care – PPO | Admitting: Internal Medicine

## 2011-01-30 ENCOUNTER — Telehealth: Payer: Self-pay | Admitting: Family Medicine

## 2011-01-30 ENCOUNTER — Ambulatory Visit: Payer: BC Managed Care – PPO | Admitting: Internal Medicine

## 2011-01-30 DIAGNOSIS — Z23 Encounter for immunization: Secondary | ICD-10-CM

## 2011-01-30 NOTE — Telephone Encounter (Signed)
Taken off Triage VM. Pt was in last wk to get shingles shot, but we were out. Her insurance expires 10/31. She needs one before then. Wants to know if we'll have them, or if we know somewhere else she can go. Please call at number given. Do not call home #, per pt.

## 2011-01-30 NOTE — Telephone Encounter (Signed)
Called to make pt aware shingles vaccine is in.  Pt will come in today at 1 pm.

## 2011-06-12 ENCOUNTER — Encounter: Payer: Self-pay | Admitting: Internal Medicine

## 2011-06-29 ENCOUNTER — Encounter: Payer: Self-pay | Admitting: Endocrinology

## 2011-06-29 ENCOUNTER — Other Ambulatory Visit (INDEPENDENT_AMBULATORY_CARE_PROVIDER_SITE_OTHER): Payer: BC Managed Care – PPO

## 2011-06-29 ENCOUNTER — Ambulatory Visit (INDEPENDENT_AMBULATORY_CARE_PROVIDER_SITE_OTHER): Payer: BC Managed Care – PPO | Admitting: Endocrinology

## 2011-06-29 VITALS — BP 112/70 | HR 71 | Temp 98.1°F | Ht 60.0 in | Wt 149.5 lb

## 2011-06-29 DIAGNOSIS — E890 Postprocedural hypothyroidism: Secondary | ICD-10-CM

## 2011-06-29 LAB — TSH: TSH: 2.26 u[IU]/mL (ref 0.35–5.50)

## 2011-06-29 NOTE — Patient Instructions (Addendum)
blood tests are being requested for you today.  You will receive a letter with results. pending the test results, please continue the same medication for now. Return here in 6 months.   most of the time, a "lumpy thyroid" will eventually become overactive again.  this is usually a slow process, happening over the span of many years. If today's blood test is normal, please have the blood test checked annually with dr Fabian Sharp.  If your thyroid becomes overactive off the medication, please return here.

## 2011-06-29 NOTE — Progress Notes (Signed)
  Subjective:    Patient ID: Laura Brooks, female    DOB: Jun 15, 1950, 61 y.o.   MRN: [id number]  HPI pt is now 16 mos s/p i-131 rx for hyperthyroidism, due to multinodular goiter. pt states she feels well in general. she does not notice the goiter.    Review of Systems Denies palpitations and tremor.     Objective:   Physical Exam VITAL SIGNS:  See vs page. GENERAL: no distress. NECK: There is no palpable thyroid enlargement.  No thyroid nodule is palpable.  No palpable lymphadenopathy at the anterior neck.         Assessment & Plan:

## 2011-06-30 ENCOUNTER — Telehealth: Payer: Self-pay | Admitting: *Deleted

## 2011-06-30 NOTE — Telephone Encounter (Signed)
Called pt to inform of TSH results, left message on VM with lab results and to callback office with any questions/concerns (Letter also mailed to pt).

## 2011-08-04 ENCOUNTER — Other Ambulatory Visit: Payer: Self-pay | Admitting: *Deleted

## 2011-08-04 MED ORDER — LEVOTHYROXINE SODIUM 75 MCG PO CAPS: 1.0000 | ORAL_CAPSULE | Freq: Every day | ORAL | Status: DC

## 2011-08-04 NOTE — Telephone Encounter (Signed)
R'cd fax from [facility] for refill of Synthroid.

## 2011-10-03 ENCOUNTER — Other Ambulatory Visit: Payer: BC Managed Care – PPO

## 2011-10-04 ENCOUNTER — Other Ambulatory Visit (INDEPENDENT_AMBULATORY_CARE_PROVIDER_SITE_OTHER): Payer: BC Managed Care – PPO

## 2011-10-04 DIAGNOSIS — Z0000 Encounter for general adult medical examination without abnormal findings: Secondary | ICD-10-CM

## 2011-10-04 LAB — CBC WITH DIFFERENTIAL/PLATELET
Eosinophils Relative: 1.4 % (ref 0.0–5.0)
Lymphocytes Relative: 31.4 % (ref 12.0–46.0)
Monocytes Relative: 8 % (ref 3.0–12.0)
Neutrophils Relative %: 58.5 % (ref 43.0–77.0)
Platelets: 210 10*3/uL (ref 150.0–400.0)
RBC: 3.7 Mil/uL — ABNORMAL LOW (ref 3.87–5.11)
WBC: 4.5 10*3/uL (ref 4.5–10.5)

## 2011-10-04 LAB — BASIC METABOLIC PANEL
BUN: 15 mg/dL (ref 6–23)
Creatinine, Ser: 0.8 mg/dL (ref 0.4–1.2)
GFR: 81.99 mL/min (ref >60.00)

## 2011-10-04 LAB — POCT URINALYSIS DIPSTICK
Glucose, UA: NEGATIVE
Nitrite, UA: NEGATIVE
Urobilinogen, UA: 0.2

## 2011-10-04 LAB — HEPATIC FUNCTION PANEL
ALT: 19 U/L (ref 0–35)
AST: 21 U/L (ref 0–37)
Alkaline Phosphatase: 38 U/L — ABNORMAL LOW (ref 39–117)
Bilirubin, Direct: 0.1 mg/dL (ref 0.0–0.3)
Total Bilirubin: 0.7 mg/dL (ref 0.3–1.2)

## 2011-10-04 LAB — LIPID PANEL
Cholesterol: 202 mg/dL — ABNORMAL HIGH (ref 0–200)
Total CHOL/HDL Ratio: 4
VLDL: 29.4 mg/dL (ref 0.0–40.0)

## 2011-10-10 ENCOUNTER — Encounter: Payer: Self-pay | Admitting: Internal Medicine

## 2011-10-10 ENCOUNTER — Ambulatory Visit (INDEPENDENT_AMBULATORY_CARE_PROVIDER_SITE_OTHER): Payer: BC Managed Care – PPO | Admitting: Internal Medicine

## 2011-10-10 VITALS — BP 124/76 | HR 86 | Temp 98.7°F | Ht 60.5 in | Wt 152.0 lb

## 2011-10-10 DIAGNOSIS — Z0000 Encounter for general adult medical examination without abnormal findings: Secondary | ICD-10-CM

## 2011-10-10 DIAGNOSIS — E890 Postprocedural hypothyroidism: Secondary | ICD-10-CM

## 2011-10-10 DIAGNOSIS — D649 Anemia, unspecified: Secondary | ICD-10-CM

## 2011-10-10 NOTE — Progress Notes (Signed)
Subjective:    Patient ID: Laura Brooks, female    DOB: October 06, 1950, 61 y.o.   MRN: [id number]  HPI Patient comes in today for preventive health care visit. Since her last visit she has done fairly well. No hospitalizations or emergency room visits. She is now post ablative therapy for her hyperthyroidism. She is on replacement therapy and Dr. Everardo All has told her to come back yearly if it is being monitored.   She is also taking multiple supplements because she was told they were good for her including fish oil vitamin C cranberry pill and cut to 10.  He cannot explain why all these are good for her but was told that they were healthy. No unusual bleeding from the bowel she did have her colonoscopy in May. She is has had a hysterectomy. She is up-to-date on mammogram. Last bone density was reported as normal she has had a successful bladder tacking. No major injuries doesn't have time to do much exercise no significant depression.   Review of Systems ROS:  GEN/ HEENT: No fever, significant weight changes sweats headaches vision problems hearing changes, CV/ PULM; No chest pain shortness of breath cough, syncope,edema  change in exercise tolerance. GI /GU: No adominal pain, vomiting, change in bowel habits. No blood in the stool. No significant GU symptoms. SKIN/HEME: ,no acute skin rashes suspicious lesions or bleeding. No lymphadenopathy, nodules, masses.  NEURO/ PSYCH:  No neurologic signs such as weakness numbness. No depression anxiety. IMM/ Allergy: No unusual infections.  Allergy .   REST of 12 system review negative except as per HPI  Outpatient Encounter Prescriptions as of 10/10/2011  Medication Sig Dispense Refill  . Acetylcarnitine HCl (ACETYL L-CARNITINE) 250 MG CAPS Take 1 capsule by mouth daily.        . Ascorbic Acid (VITAMIN C) 1000 MG tablet Take 1,000 mg by mouth daily.        . Calcium Carbonate-Vitamin D (CALCIUM + D) 600-200 MG-UNIT TABS Take 1 capsule by mouth daily.         Marland Kitchen CRANBERRY EXTRACT PO Take 4,200 mg by mouth 2 (two) times daily.        . Cyanocobalamin (VITAMIN B 12 PO) Take 250 mg by mouth daily.        . fish oil-omega-3 fatty acids 1000 MG capsule Take 1 g by mouth daily.        . Levothyroxine Sodium 75 MCG CAPS Take 1 capsule (75 mcg total) by mouth daily.  30 capsule  5  . Magnesium 100 MG CAPS Take 1 capsule by mouth daily.        . Multiple Vitamins-Minerals (MULTIVITAMIN WITH MINERALS) tablet Take 1 tablet by mouth daily.        . peg 3350 powder (MOVIPREP) 100 G SOLR MOVI PREP take as directed  1 kit  0  . PREMARIN vaginal cream Place 1 Applicatorful vaginally 3 (three) times a week.      . valACYclovir (VALTREX) 1000 MG tablet as needed.        Facility-Administered Encounter Medications as of 10/10/2011  Medication Dose Route Frequency Provider Last Rate Last Dose  . 0.9 %  sodium chloride infusion  500 mL Intravenous Continuous Hart Carwin, MD            Objective:   Physical Exam BP 124/76  Pulse 86  Temp 98.7 F (37.1 C) (Oral)  Ht 5' 0.5" (1.537 m)  Wt 152 lb (68.947 kg)  BMI 29.20  kg/m2  SpO2 98% Physical Exam: Vital signs reviewed ZOX:WRUE is a well-developed well-nourished alert cooperative  white female who appears her stated age in no acute distress.  HEENT: normocephalic atraumatic , Eyes: PERRL EOM's full, conjunctiva clear, Nares: paten,t no deformity discharge or tenderness., Ears: no deformity EAC's clear TMs with normal landmarks. Mouth: clear OP, no lesions, edema.  Moist mucous membranes. Dentition in adequate repair. NECK: supple without masses, thyromegaly or bruits. CHEST/PULM:  Clear to auscultation and percussion breath sounds equal no wheeze , rales or rhonchi. No chest wall deformities or tenderness. Breast: normal by inspection . No dimpling, discharge, masses, tenderness or discharge . CV: PMI is nondisplaced, S1 S2 no gallops, murmurs, rubs. Peripheral pulses are full without delay.No JVD .    ABDOMEN: Bowel sounds normal nontender  No guard or rebound, no hepato splenomegal no CVA tenderness.  No hernia. Extremtities:  No clubbing cyanosis or edema, no acute joint swelling or redness no focal atrophy NEURO:  Oriented x3, cranial nerves 3-12 appear to be intact, no obvious focal weakness,gait within normal limits no abnormal reflexes or asymmetrical SKIN: No acute rashes normal turgor, color, no bruising or petechiae. PSYCH: Oriented, good eye contact, no obvious depression anxiety, cognition and judgment appear normal. LN: no cervical axillary inguinal adenopathy    Lab Results  Component Value Date   WBC 4.5 10/04/2011   HGB 11.8* 10/04/2011   HCT 35.0* 10/04/2011   PLT 210.0 10/04/2011   GLUCOSE 92 10/04/2011   CHOL 202* 10/04/2011   TRIG 147.0 10/04/2011   HDL 52.60 10/04/2011   LDLDIRECT 115.4 10/04/2011   LDLCALC 118* 05/13/2008   ALT 19 10/04/2011   AST 21 10/04/2011   NA 141 10/04/2011   K 4.1 10/04/2011   CL 105 10/04/2011   CREATININE 0.8 10/04/2011   BUN 15 10/04/2011   CO2 28 10/04/2011   TSH 0.93 10/04/2011   INR 1.08 09/20/2010       Assessment & Plan:  Preventive Health Care Counseled regarding healthy nutrition, exercise, sleep, injury prevention, calcium vit d and healthy weight .  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 Mild anemia  unclear etiology doesn't donate blood perhaps an absorption issue no other alarm symptoms. Has had a negative colonoscopy screening.  We'll recheck these labs in a number of months and get iron studies B12 will repeat her TSH. Supplement use  handout given.  Reviewed the lack of credible indications and clinical outcomes for supplements and vitamins  unless for specific disease state.

## 2011-10-10 NOTE — Patient Instructions (Signed)
  Next blood draw  Get iron levels and b12 levels again.   Continue lifestyle intervention healthy eating and exercise . CPX in a year

## 2012-01-12 ENCOUNTER — Ambulatory Visit (INDEPENDENT_AMBULATORY_CARE_PROVIDER_SITE_OTHER): Payer: BC Managed Care – PPO | Admitting: Family Medicine

## 2012-01-12 DIAGNOSIS — Z23 Encounter for immunization: Secondary | ICD-10-CM

## 2012-01-26 ENCOUNTER — Other Ambulatory Visit: Payer: Self-pay | Admitting: Endocrinology

## 2012-04-12 ENCOUNTER — Other Ambulatory Visit (INDEPENDENT_AMBULATORY_CARE_PROVIDER_SITE_OTHER): Payer: BC Managed Care – PPO

## 2012-04-12 DIAGNOSIS — E039 Hypothyroidism, unspecified: Secondary | ICD-10-CM

## 2012-04-12 DIAGNOSIS — D519 Vitamin B12 deficiency anemia, unspecified: Secondary | ICD-10-CM

## 2012-04-12 DIAGNOSIS — D518 Other vitamin B12 deficiency anemias: Secondary | ICD-10-CM

## 2012-04-12 LAB — CBC WITH DIFFERENTIAL/PLATELET
Basophils Absolute: 0 10*3/uL (ref 0.0–0.1)
HCT: 37.3 % (ref 36.0–46.0)
Lymphs Abs: 1.6 10*3/uL (ref 0.7–4.0)
Monocytes Absolute: 0.4 10*3/uL (ref 0.1–1.0)
Monocytes Relative: 9.3 % (ref 3.0–12.0)
Platelets: 228 10*3/uL (ref 150.0–400.0)
RDW: 12.7 % (ref 11.5–14.6)

## 2012-04-12 LAB — VITAMIN B12: Vitamin B-12: 988 pg/mL — ABNORMAL HIGH (ref 211–911)

## 2012-04-12 LAB — FERRITIN: Ferritin: 70.9 ng/mL (ref 10.0–291.0)

## 2012-04-17 NOTE — Progress Notes (Signed)
Quick Note:  Called and spoke with pt and pt is aware. ______ 

## 2012-04-23 ENCOUNTER — Other Ambulatory Visit: Payer: Self-pay

## 2012-04-23 ENCOUNTER — Other Ambulatory Visit: Payer: Self-pay | Admitting: Endocrinology

## 2012-05-06 IMAGING — CR DG KNEE 3 VIEWS*L*
4 series · 4 of 4 positions shown · non-contrast
Comparison: None.

CLINICAL DATA: Fell yesterday with pain

LEFT KNEE - 3 VIEW

[AP]
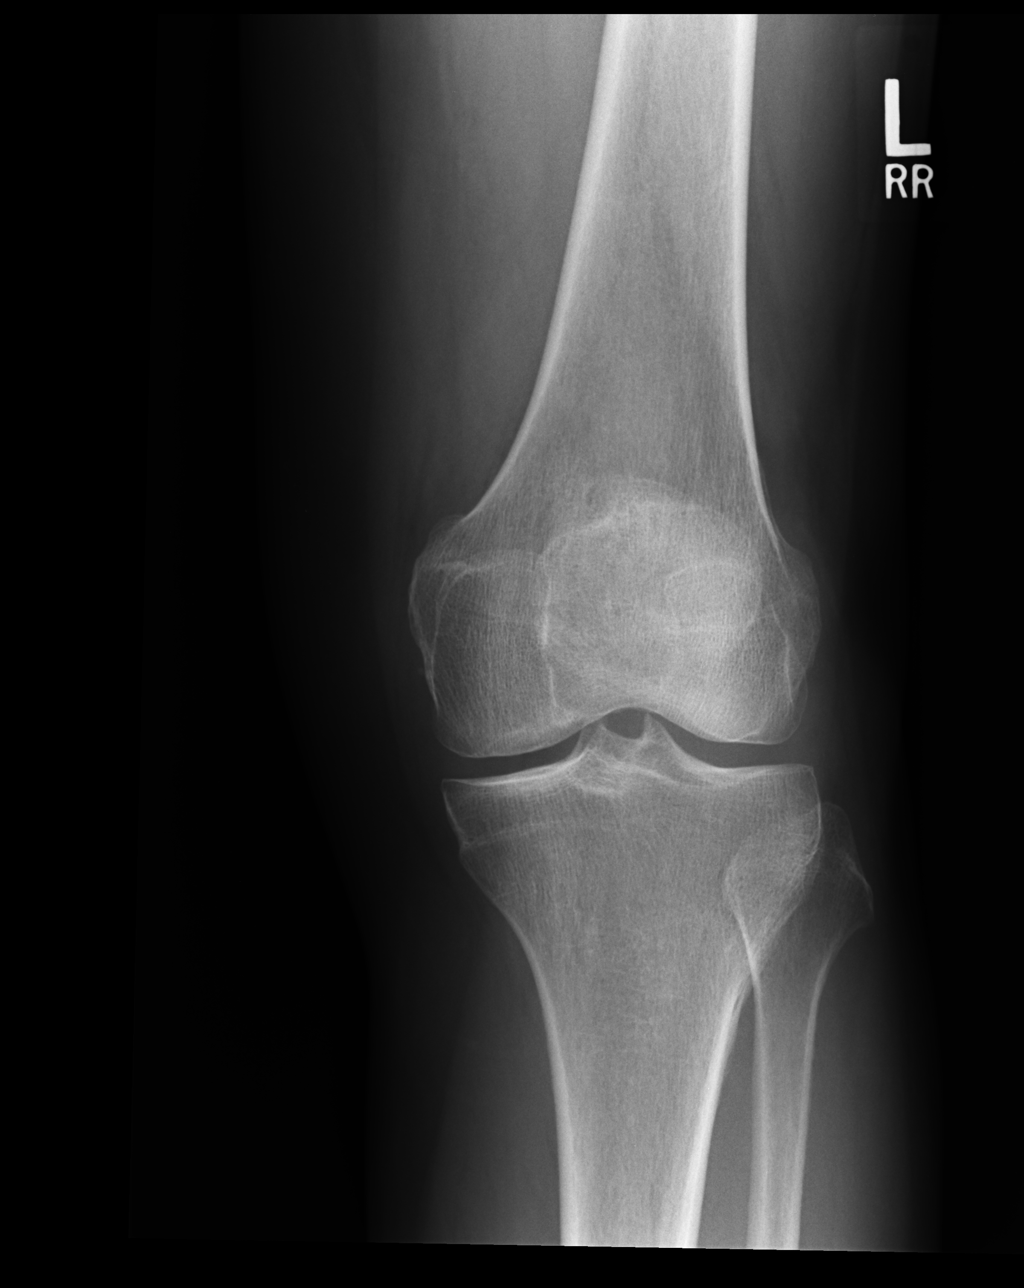

[lateral]
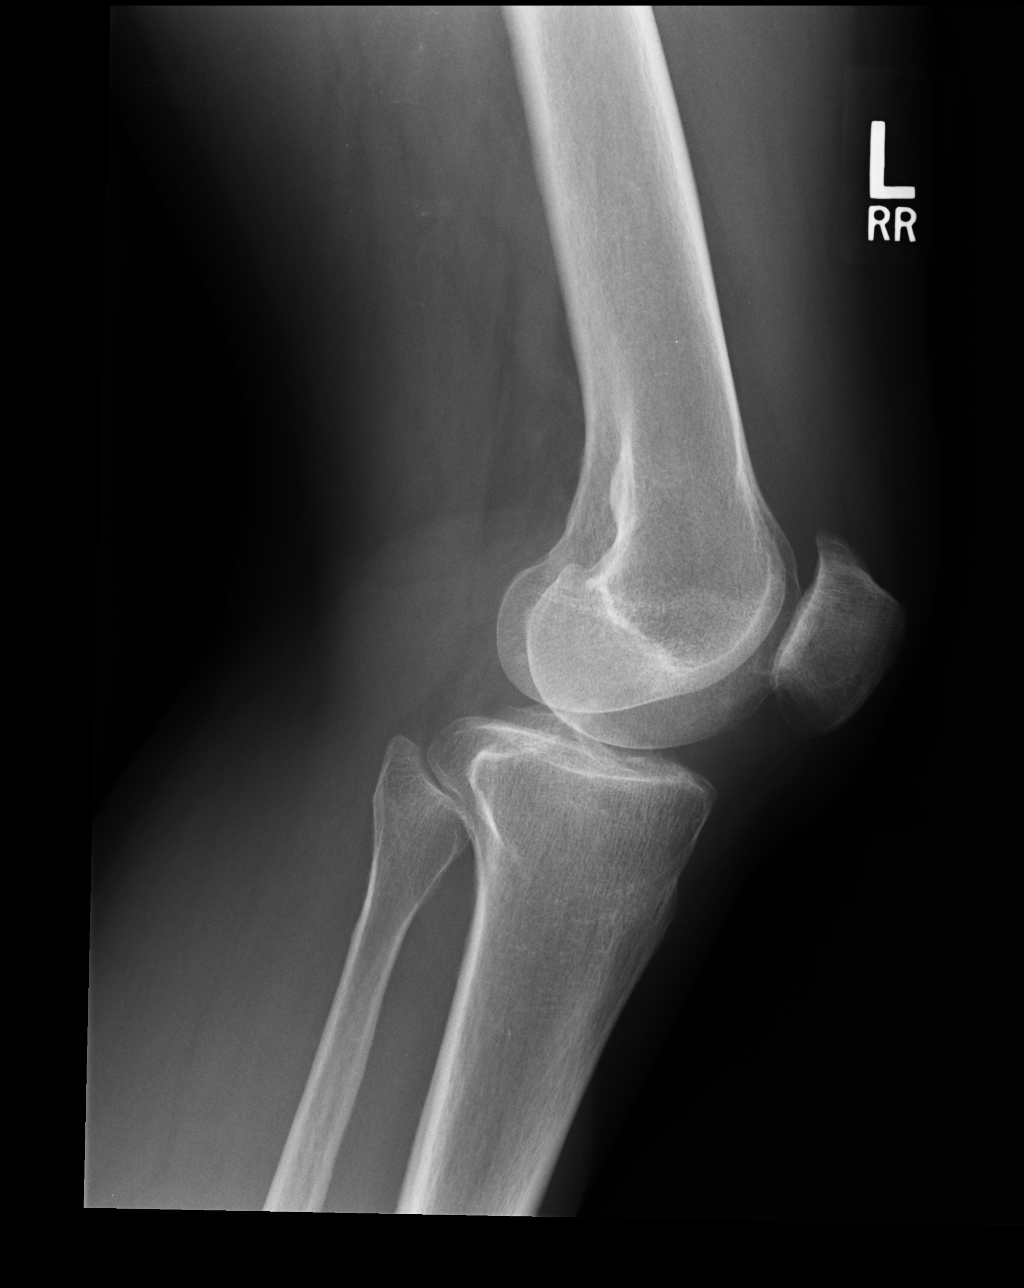

[pa axial]
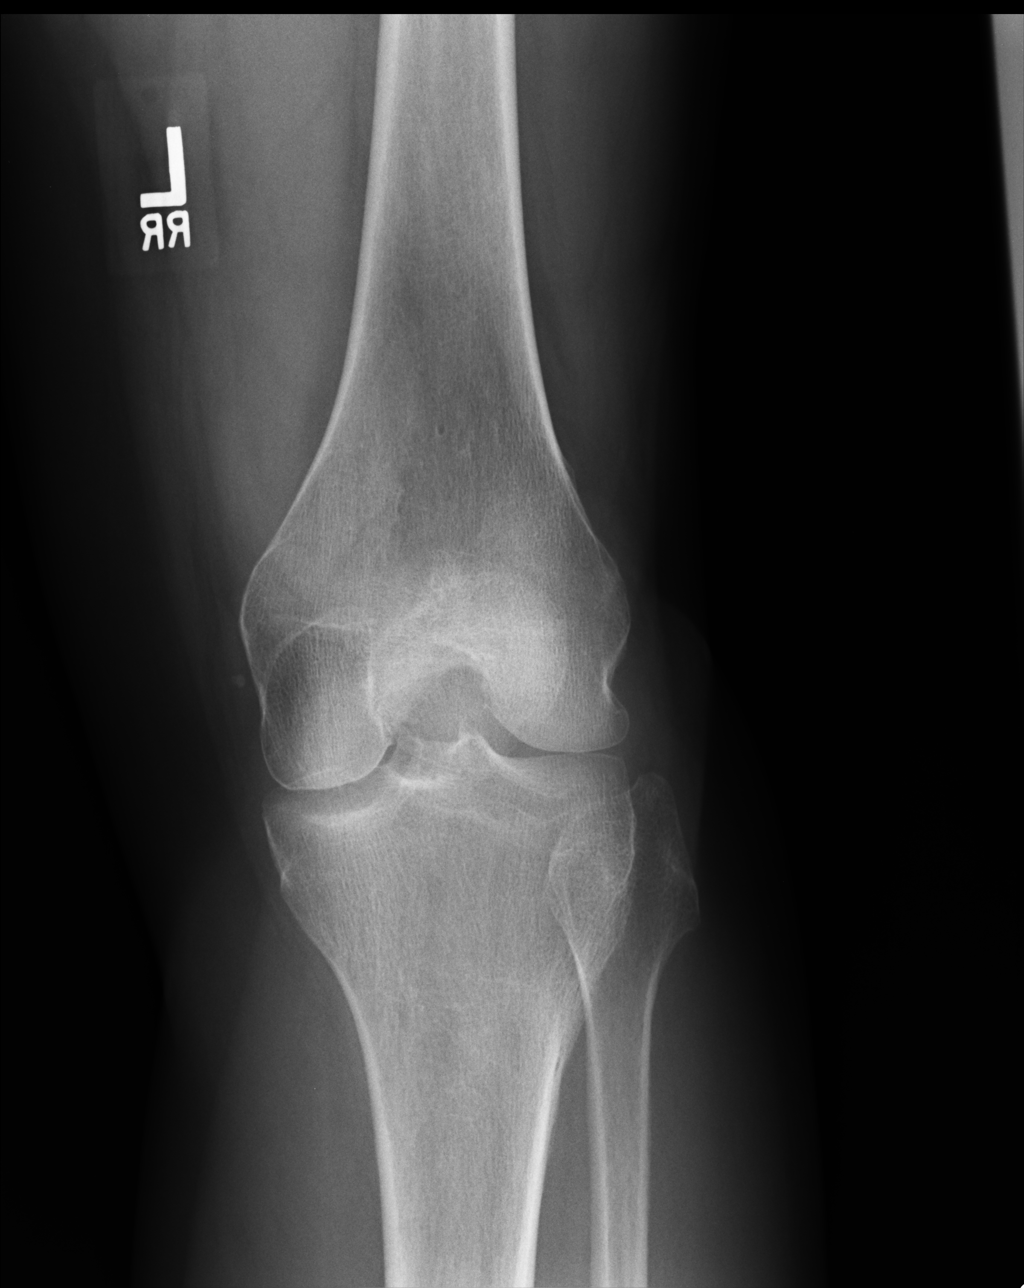

[sunrise]
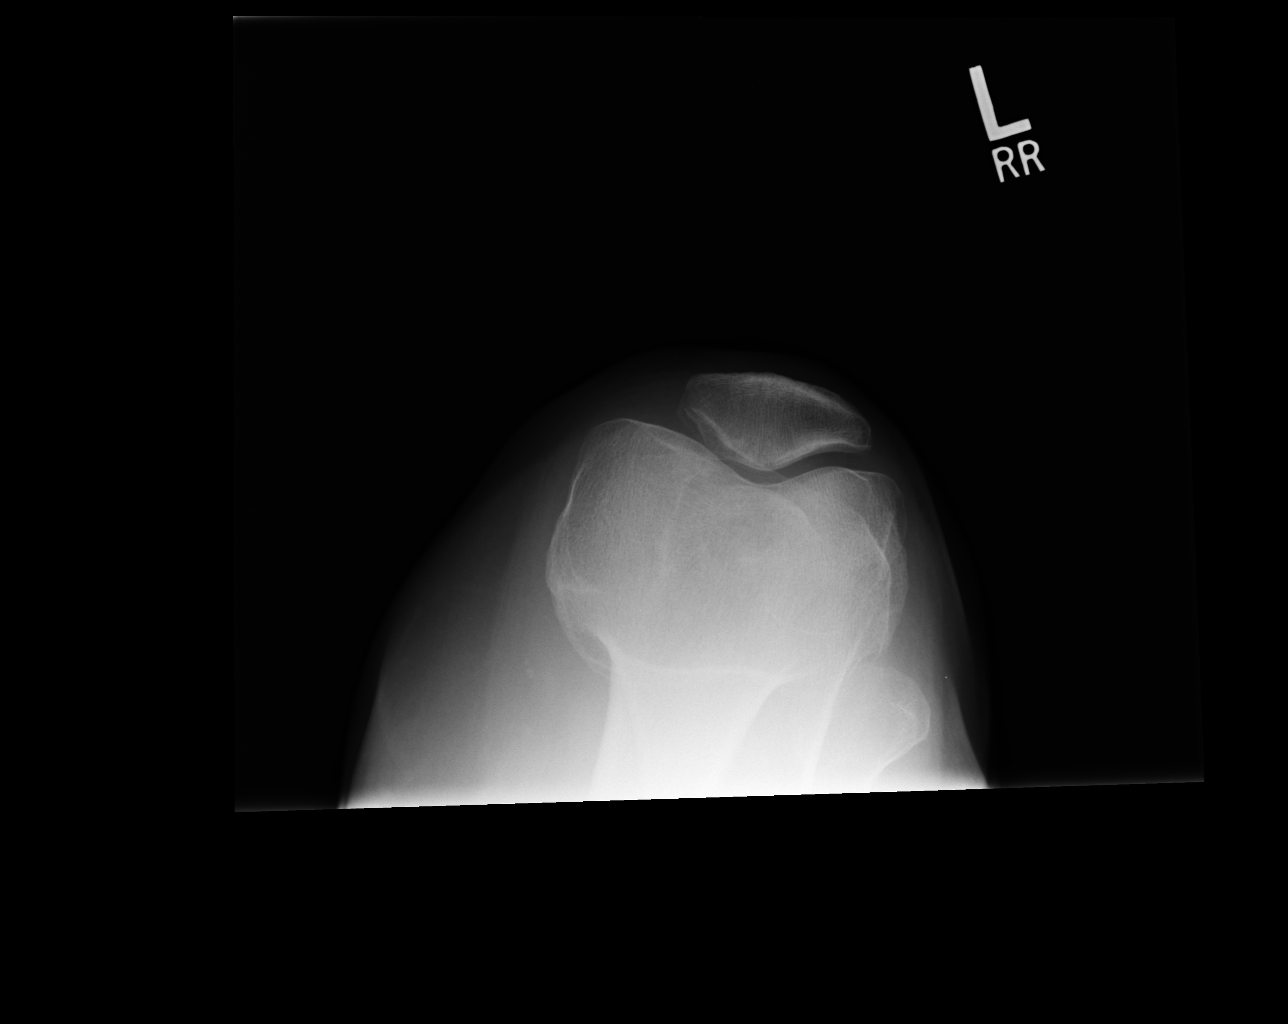

[4 of 4 positions shown; findings below may reference images not displayed]

FINDINGS: The knee joint spaces are relatively normal.  No fracture
is seen.  No effusion is noted.
IMPRESSION: No acute abnormality.

## 2012-06-18 ENCOUNTER — Other Ambulatory Visit: Payer: Self-pay | Admitting: Endocrinology

## 2012-06-18 ENCOUNTER — Other Ambulatory Visit: Payer: Self-pay | Admitting: *Deleted

## 2012-06-18 MED ORDER — LEVOTHYROXINE SODIUM 75 MCG PO TABS: 75.0000 ug | ORAL_TABLET | Freq: Every day | ORAL | Status: DC

## 2012-07-15 ENCOUNTER — Other Ambulatory Visit: Payer: Self-pay | Admitting: Endocrinology

## 2012-07-16 ENCOUNTER — Other Ambulatory Visit: Payer: Self-pay | Admitting: *Deleted

## 2012-07-16 ENCOUNTER — Other Ambulatory Visit: Payer: Self-pay | Admitting: Family Medicine

## 2012-07-16 ENCOUNTER — Telehealth: Payer: Self-pay

## 2012-07-16 MED ORDER — LEVOTHYROXINE SODIUM 75 MCG PO TABS: 75.0000 ug | ORAL_TABLET | Freq: Every day | ORAL | Status: DC

## 2012-07-16 NOTE — Telephone Encounter (Signed)
Pt advised per Dr. George Hugh message, Dr. Fabian Sharp to follow and refill rx.

## 2012-07-16 NOTE — Telephone Encounter (Signed)
Pt called requesting rx refill of Levothyrxine, pt has not seen you in over 1 year, pt states she has seen Dr. Fabian Sharp and had a cpe and labs done, and you told her she did not have to follow up unless she was having problems, ok to fill med?

## 2012-07-16 NOTE — Telephone Encounter (Signed)
Left message on personally identified home number informing her of all.  Rx sent to the [facility] on [address]

## 2012-07-16 NOTE — Telephone Encounter (Signed)
Instructed patient to get on Dr. George Hugh schedule.  Can you give her a 30 days supply.  She will be out of town for 2 weeks come Thursday.  She stated that Dr. Everardo All told her that since you are going her lab work then maybe you will take over her levothyroxine.  Do you plan to take over this medication or should she stay with Dr. Everardo All.  Please advise.  Thanks!!

## 2012-07-16 NOTE — Telephone Encounter (Signed)
[facility] [address]

## 2012-07-16 NOTE — Telephone Encounter (Signed)
Patient is leaving town on Thursday and need her refill and would like to be called when done on [phone].

## 2012-07-16 NOTE — Telephone Encounter (Signed)
It is fine with me to have dr Fabian Sharp follow this, but please then forward refill request to her.

## 2012-07-16 NOTE — Telephone Encounter (Signed)
Per WP, will send in six months.  Will need to be seen for CPE.  Dr. Fabian Sharp will take over levothyroxine unless there is a problem and then she will go back to see Dr. Everardo All.

## 2012-08-26 ENCOUNTER — Encounter: Payer: Self-pay | Admitting: Internal Medicine

## 2012-10-28 ENCOUNTER — Other Ambulatory Visit (INDEPENDENT_AMBULATORY_CARE_PROVIDER_SITE_OTHER): Payer: BC Managed Care – PPO

## 2012-10-28 DIAGNOSIS — E785 Hyperlipidemia, unspecified: Secondary | ICD-10-CM

## 2012-10-28 DIAGNOSIS — Z0000 Encounter for general adult medical examination without abnormal findings: Secondary | ICD-10-CM

## 2012-10-28 LAB — HEPATIC FUNCTION PANEL
ALT: 27 U/L (ref 0–35)
AST: 23 U/L (ref 0–37)
Albumin: 3.9 g/dL (ref 3.5–5.2)

## 2012-10-28 LAB — LIPID PANEL
HDL: 55.5 mg/dL (ref >39.00)
Triglycerides: 156 mg/dL — ABNORMAL HIGH (ref 0.0–149.0)
VLDL: 31.2 mg/dL (ref 0.0–40.0)

## 2012-10-28 LAB — BASIC METABOLIC PANEL
BUN: 18 mg/dL (ref 6–23)
Chloride: 106 mEq/L (ref 96–112)
Glucose, Bld: 101 mg/dL — ABNORMAL HIGH (ref 70–99)
Potassium: 4.1 mEq/L (ref 3.5–5.1)

## 2012-10-28 LAB — CBC WITH DIFFERENTIAL/PLATELET
Basophils Absolute: 0 10*3/uL (ref 0.0–0.1)
HCT: 38.7 % (ref 36.0–46.0)
Lymphs Abs: 1.4 10*3/uL (ref 0.7–4.0)
MCV: 94.6 fl (ref 78.0–100.0)
Monocytes Absolute: 0.5 10*3/uL (ref 0.1–1.0)
Platelets: 238 10*3/uL (ref 150.0–400.0)
RDW: 13.1 % (ref 11.5–14.6)

## 2012-10-28 LAB — LDL CHOLESTEROL, DIRECT: Direct LDL: 158.3 mg/dL

## 2012-10-28 LAB — TSH: TSH: 1.74 u[IU]/mL (ref 0.35–5.50)

## 2012-10-30 ENCOUNTER — Encounter: Payer: Self-pay | Admitting: Internal Medicine

## 2012-10-30 ENCOUNTER — Ambulatory Visit (INDEPENDENT_AMBULATORY_CARE_PROVIDER_SITE_OTHER): Payer: BC Managed Care – PPO | Admitting: Internal Medicine

## 2012-10-30 VITALS — BP 126/78 | HR 70 | Temp 98.0°F

## 2012-10-30 DIAGNOSIS — E785 Hyperlipidemia, unspecified: Secondary | ICD-10-CM

## 2012-10-30 DIAGNOSIS — E890 Postprocedural hypothyroidism: Secondary | ICD-10-CM

## 2012-10-30 DIAGNOSIS — M899 Disorder of bone, unspecified: Secondary | ICD-10-CM

## 2012-10-30 DIAGNOSIS — Z0000 Encounter for general adult medical examination without abnormal findings: Secondary | ICD-10-CM

## 2012-10-30 NOTE — Progress Notes (Signed)
Chief Complaint  Patient presents with  . Annual Exam    HPI: Patient comes in today for Preventive Health Care visit  No major change in health status since last visit .   Not enough exercise  And just had vacation pre labs.   Take 2 vit d  Otc.    Has gyne appt in fall. ROS:  GEN/ HEENT: No fever, significant weight changes sweats headaches vision problems hearing changes, CV/ PULM; No chest pain shortness of breath cough, syncope,edema  change in exercise tolerance. GI /GU: No adominal pain, vomiting, change in bowel habits. No blood in the stool. No significant GU symptoms. SKIN/HEME: ,no acute skin rashes suspicious lesions or bleeding. No lymphadenopathy, nodules, masses.  NEURO/ PSYCH:  No neurologic signs such as weakness numbness. No depression anxiety. IMM/ Allergy: No unusual infections.  Allergy .   REST of 12 system review negative except as per HPI   Past Medical History  Diagnosis Date  . Osteopenia     dexa 2009   . Radiculopathy, lumbar region     L4L5  . Benign microscopic hematuria     neg urol eval in past  . 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 HYPERLIPIDEMIA 04/08/2008    Qualifier: Diagnosis of  By: Fabian Sharp MD, Neta Mends   . HEMATURIA UNSPECIFIED 05/17/2007    Qualifier: Diagnosis of  By: Lawernce Ion, CMA (AAMA), Bethann Berkshire     Family History  Problem Relation Age of Onset  . Anemia Father   . Thyroid disease Sister     History   Social History  . Marital Status: Divorced    Spouse Name: N/A    Number of Children: N/A  . Years of Education: N/A   Occupational History  . [employer]   . ASSIST OF DIRECT FUND     Social History Main Topics  . Smoking status: Former Games developer  . Smokeless tobacco: Never Used  . Alcohol Use: 0.5 oz/week    1 drink(s) per week  . Drug Use: No  . Sexually Active: None   Other Topics Concern  . None   Social History Narrative   Divorced former Smoker   Works Teaching laboratory technician  of 2  Pet fish and parrot.    Outpatient Encounter Prescriptions as of 10/30/2012  Medication Sig Dispense Refill  . Acetylcarnitine HCl (ACETYL L-CARNITINE) 250 MG CAPS Take 1 capsule by mouth daily.        . Ascorbic Acid (VITAMIN C) 1000 MG tablet Take 1,000 mg by mouth daily.        . Calcium Carbonate-Vitamin D (CALCIUM + D) 600-200 MG-UNIT TABS Take 1 capsule by mouth daily.        Marland Kitchen CRANBERRY EXTRACT PO Take 4,200 mg by mouth 2 (two) times daily.        . Cyanocobalamin (VITAMIN B 12 PO) Take 250 mg by mouth daily.        Marland Kitchen estradiol (ESTRACE) 0.1 MG/GM vaginal cream Place 2 g vaginally 2 (two) times a week.      . fish oil-omega-3 fatty acids 1000 MG capsule Take 1 g by mouth daily.        Marland Kitchen levothyroxine (SYNTHROID, LEVOTHROID) 75 MCG tablet Take 1 tablet (75 mcg total) by mouth daily.  30 tablet  5  . Magnesium 100 MG CAPS Take 1 capsule by mouth daily.        . Multiple  Vitamins-Minerals (MULTIVITAMIN WITH MINERALS) tablet Take 1 tablet by mouth daily.        . peg 3350 powder (MOVIPREP) 100 G SOLR MOVI PREP take as directed  1 kit  0  . valACYclovir (VALTREX) 1000 MG tablet as needed.       . [DISCONTINUED] PREMARIN vaginal cream Place 1 Applicatorful vaginally 3 (three) times a week.      . [DISCONTINUED] 0.9 %  sodium chloride infusion        No facility-administered encounter medications on file as of 10/30/2012.    EXAM:  BP 126/78  Pulse 70  Temp(Src) 98 F (36.7 C) (Oral)  SpO2 96%  Body mass index is 0.00 kg/(m^2).  Physical Exam: Vital signs reviewed WUJ:WJXB is a well-developed well-nourished alert cooperative   female who appears her stated age in no acute distress.  HEENT: normocephalic atraumatic , Eyes: PERRL EOM's full, conjunctiva clear, Nares: paten,t no deformity discharge or tenderness., Ears: no deformity EAC's clear TMs with normal landmarks. Mouth: clear OP, no lesions, edema.  Moist mucous membranes. Dentition in adequate repair. NECK: supple  without masses, thyromegaly or bruits. CHEST/PULM:  Clear to auscultation and percussion breath sounds equal no wheeze , rales or rhonchi. No chest wall deformities or tenderness. CV: PMI is nondisplaced, S1 S2 no gallops, murmurs, rubs. Peripheral pulses are full without delay.No JVD .  Breast: normal by inspection . No dimpling, discharge, masses, tenderness or discharge . ABDOMEN: Bowel sounds normal nontender  No guard or rebound, no hepato splenomegal no CVA tenderness.  No hernia. Extremtities:  No clubbing cyanosis or edema, no acute joint swelling or redness no focal atrophy NEURO:  Oriented x3, cranial nerves 3-12 appear to be intact, no obvious focal weakness,gait within normal limits no abnormal reflexes or asymmetrical SKIN: No acute rashes normal turgor, color, no bruising or petechiae. PSYCH: Oriented, good eye contact, no obvious depression anxiety, cognition and judgment appear normal. LN: no cervical axillary inguinal adenopathy  Lab Results  Component Value Date   WBC 5.5 10/28/2012   HGB 13.1 10/28/2012   HCT 38.7 10/28/2012   PLT 238.0 10/28/2012   GLUCOSE 101* 10/28/2012   CHOL 243* 10/28/2012   TRIG 156.0* 10/28/2012   HDL 55.50 10/28/2012   LDLDIRECT 158.3 10/28/2012   LDLCALC 118* 05/13/2008   ALT 27 10/28/2012   AST 23 10/28/2012   NA 140 10/28/2012   K 4.1 10/28/2012   CL 106 10/28/2012   CREATININE 0.8 10/28/2012   BUN 18 10/28/2012   CO2 28 10/28/2012   TSH 1.74 10/28/2012   INR 1.08 09/20/2010    ASSESSMENT AND PLAN:  Discussed the following assessment and plan:  Visit for preventive health examination  HYPOTHYROIDISM, POST-RADIATION - no change dose can refill when needed  yearly check see dr Everardo All note.  OSTEOPENIA  HYPERLIPIDEMIA - up    intensify lsi  and follow disc guidelines for medication use. Counseled regarding healthy nutrition, exercise, sleep, injury prevention, calcium vit d and healthy weight .  Patient Care Team: Madelin Headings, MD as PCP -  General Romero Belling, MD (Internal Medicine) Alison Murray, MD (Obstetrics and Gynecology) Terrilee Croak as Referring Physician (Internal Medicine) Patient Instructions  Continue lifestyle intervention healthy eating and exercise . Mediterranean diet  Is heart healthy. More activity more sleep . Avoid processed foods .  Check blood pressure readings occassionally to make sure normal.    Neta Mends. Panosh M.D.  Health Maintenance  Topic Date Due  .  Pap Smear  09/08/2012  . Influenza Vaccine  12/09/2012  . Mammogram  06/20/2014  . Colonoscopy  08/24/2020  . Tetanus/tdap  01/11/2022  . Zostavax  Completed   Health Maintenance Review

## 2012-10-30 NOTE — Patient Instructions (Signed)
Continue lifestyle intervention healthy eating and exercise . Mediterranean diet  Is heart healthy. More activity more sleep . Avoid processed foods .  Check blood pressure readings occassionally to make sure normal.

## 2013-01-28 ENCOUNTER — Ambulatory Visit (INDEPENDENT_AMBULATORY_CARE_PROVIDER_SITE_OTHER): Payer: BC Managed Care – PPO | Admitting: Internal Medicine

## 2013-01-28 ENCOUNTER — Encounter: Payer: Self-pay | Admitting: Internal Medicine

## 2013-01-28 VITALS — BP 124/64 | HR 79 | Temp 98.1°F | Wt 160.0 lb

## 2013-01-28 DIAGNOSIS — J988 Other specified respiratory disorders: Secondary | ICD-10-CM

## 2013-01-28 DIAGNOSIS — J040 Acute laryngitis: Secondary | ICD-10-CM

## 2013-01-28 DIAGNOSIS — J22 Unspecified acute lower respiratory infection: Secondary | ICD-10-CM

## 2013-01-28 NOTE — Patient Instructions (Signed)
This viral respiratory infection  Voice rest hot fluids   Hot tea and honey at night .   Can try otc medicines  May not help.    Try older generations  Antihistamines such as chlorpheniramine   At night to see if helps cough at night .       Laryngitis At the top of your windpipe is your voice box. It is the source of your voice. Inside your voice box are 2 bands of muscles called vocal cords. When you breathe, your vocal cords are relaxed and open so that air can get into the lungs. When you decide to say something, these cords come together and vibrate. The sound from these vibrations goes into your throat and comes out through your mouth as sound. Laryngitis is an inflammation of the vocal cords that causes hoarseness, cough, loss of voice, sore throat, and dry throat. Laryngitis can be temporary (acute) or long-term (chronic). Most cases of acute laryngitis improve with time.Chronic laryngitis lasts for more than 3 weeks. CAUSES Laryngitis can often be related to excessive smoking, talking, or yelling, as well as inhalation of toxic fumes and allergies. Acute laryngitis is usually caused by a viral infection, vocal strain, measles or mumps, or bacterial infections. Chronic laryngitis is usually caused by vocal cord strain, vocal cord injury, postnasal drip, growths on the vocal cords, or acid reflux. SYMPTOMS   Cough.  Sore throat.  Dry throat. RISK FACTORS  Respiratory infections.  Exposure to irritating substances, such as cigarette smoke, excessive amounts of alcohol, stomach acids, and workplace chemicals.  Voice trauma, such as vocal cord injury from shouting or speaking too loud. DIAGNOSIS  Your cargiver will perform a physical exam. During the physical exam, your caregiver will examine your throat. The most common sign of laryngitis is hoarseness. Laryngoscopy may be necessary to confirm the diagnosis of this condition. This procedure allows your caregiver to look into the  larynx. HOME CARE INSTRUCTIONS  Drink enough fluids to keep your urine clear or pale yellow.  Rest until you no longer have symptoms or as directed by your caregiver.  Breathe in moist air.  Take all medicine as directed by your caregiver.  Do not smoke.  Talk as little as possible (this includes whispering).  Write on paper instead of talking until your voice is back to normal.  Follow up with your caregiver if your condition has not improved after 10 days. SEEK MEDICAL CARE IF:   You have trouble breathing.  You cough up blood.  You have persistent fever.  You have increasing pain.  You have difficulty swallowing. MAKE SURE YOU:  Understand these instructions.  Will watch your condition.  Will get help right away if you are not doing well or get worse. Document Released: 03/27/2005 Document Revised: 06/19/2011 Document Reviewed: 06/02/2010 [facility] Patient Information 2014 [place], [place].

## 2013-01-28 NOTE — Progress Notes (Signed)
Chief Complaint  Patient presents with  . Cough    Cough is dry.  Started last Tuesday.  Has tried Mucinex OTC.  Marland Kitchen Nasal Congestion    HPI: Patient comes in today for SDA for  new problem evaluation. Acute onset of laryngitis  To leave for [place]   November early Soon. No fever.   [place]  Hoarse and dry cough worse at night . No fever sob  Wheezing  Onset 1 week ago  ROS: See pertinent positives and negatives per HPI.  Past Medical History  Diagnosis Date  . Osteopenia     dexa 2009   . Radiculopathy, lumbar region     L4L5  . Benign microscopic hematuria     neg urol eval in past  . 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 HYPERLIPIDEMIA 04/08/2008    Qualifier: Diagnosis of  By: Fabian Sharp MD, Neta Mends   . HEMATURIA UNSPECIFIED 05/17/2007    Qualifier: Diagnosis of  By: Lawernce Ion, CMA (AAMA), Bethann Berkshire     Family History  Problem Relation Age of Onset  . Anemia Father   . Thyroid disease Sister     History   Social History  . Marital Status: Divorced    Spouse Name: N/A    Number of Children: N/A  . Years of Education: N/A   Occupational History  . [employer]   . ASSIST OF DIRECT FUND     Social History Main Topics  . Smoking status: Former Games developer  . Smokeless tobacco: Never Used  . Alcohol Use: 0.5 oz/week    1 drink(s) per week  . Drug Use: No  . Sexual Activity: None   Other Topics Concern  . None   Social History Narrative   Divorced former Smoker   Works Teaching laboratory technician of 2  Pet fish and parrot.    Outpatient Encounter Prescriptions as of 01/28/2013  Medication Sig Dispense Refill  . Ascorbic Acid (VITAMIN C) 1000 MG tablet Take 1,000 mg by mouth daily.        . Calcium Carbonate-Vitamin D (CALCIUM + D) 600-200 MG-UNIT TABS Take 1 capsule by mouth daily.        Marland Kitchen CRANBERRY EXTRACT PO Take 4,200 mg by mouth 2 (two) times daily.        . Cyanocobalamin (VITAMIN B 12 PO) Take 250 mg by mouth daily.         Marland Kitchen estradiol (ESTRACE) 0.1 MG/GM vaginal cream Place 2 g vaginally 2 (two) times a week.      . fish oil-omega-3 fatty acids 1000 MG capsule Take 1 g by mouth daily.        Marland Kitchen levothyroxine (SYNTHROID, LEVOTHROID) 75 MCG tablet Take 1 tablet (75 mcg total) by mouth daily.  30 tablet  5  . Magnesium 100 MG CAPS Take 1 capsule by mouth daily.        . Multiple Vitamins-Minerals (MULTIVITAMIN WITH MINERALS) tablet Take 1 tablet by mouth daily.        . valACYclovir (VALTREX) 1000 MG tablet as needed.       . peg 3350 powder (MOVIPREP) 100 G SOLR MOVI PREP take as directed  1 kit  0  . [DISCONTINUED] Acetylcarnitine HCl (ACETYL L-CARNITINE) 250 MG CAPS Take 1 capsule by mouth daily.         No facility-administered encounter medications on file as of 01/28/2013.    EXAM:  BP 124/64  Pulse 79  Temp(Src) 98.1 F (36.7 C) (Oral)  Wt 160 lb (72.576 kg)  BMI 30.72 kg/m2  SpO2 98%  Body mass index is 30.72 kg/(m^2).  GENERAL: vitals reviewed and listed above, alert, oriented, appears well hydrated and in no acute distress mildy hoarse  Dry cough   HEENT: atraumatic, conjunctiva  clear, no obvious abnormalities on inspection of external nose and earstm intact  nars min congestion OP : no lesion edema or exudate  No stridor NECK: no obvious masses on inspection palpation  No adenopathy LUNGS: clear to auscultation bilaterally, no wheezes, rales or rhonchi, good air movement CV: HRRR, no clubbing cyanosis or  peripheral edema nl cap refill  MS: moves all extremities without noticeable focal  abnormality PSYCH: pleasant and cooperative, no obvious depression or anxiety  ASSESSMENT AND PLAN:  Discussed the following assessment and plan:  Acute viral laryngitis - most likely viral  with minimal drainage exp management . Cough may last total 2-3 weeks.  Declined  rxCough med at this time  Can call if wishes in future -Patient advised to return or notify health care team  if symptoms worsen or  persist or new concerns arise.  Patient Instructions  This viral respiratory infection  Voice rest hot fluids   Hot tea and honey at night .   Can try otc medicines  May not help.    Try older generations  Antihistamines such as chlorpheniramine   At night to see if helps cough at night .       Laryngitis At the top of your windpipe is your voice box. It is the source of your voice. Inside your voice box are 2 bands of muscles called vocal cords. When you breathe, your vocal cords are relaxed and open so that air can get into the lungs. When you decide to say something, these cords come together and vibrate. The sound from these vibrations goes into your throat and comes out through your mouth as sound. Laryngitis is an inflammation of the vocal cords that causes hoarseness, cough, loss of voice, sore throat, and dry throat. Laryngitis can be temporary (acute) or long-term (chronic). Most cases of acute laryngitis improve with time.Chronic laryngitis lasts for more than 3 weeks. CAUSES Laryngitis can often be related to excessive smoking, talking, or yelling, as well as inhalation of toxic fumes and allergies. Acute laryngitis is usually caused by a viral infection, vocal strain, measles or mumps, or bacterial infections. Chronic laryngitis is usually caused by vocal cord strain, vocal cord injury, postnasal drip, growths on the vocal cords, or acid reflux. SYMPTOMS   Cough.  Sore throat.  Dry throat. RISK FACTORS  Respiratory infections.  Exposure to irritating substances, such as cigarette smoke, excessive amounts of alcohol, stomach acids, and workplace chemicals.  Voice trauma, such as vocal cord injury from shouting or speaking too loud. DIAGNOSIS  Your cargiver will perform a physical exam. During the physical exam, your caregiver will examine your throat. The most common sign of laryngitis is hoarseness. Laryngoscopy may be necessary to confirm the diagnosis of this condition.  This procedure allows your caregiver to look into the larynx. HOME CARE INSTRUCTIONS  Drink enough fluids to keep your urine clear or pale yellow.  Rest until you no longer have symptoms or as directed by your caregiver.  Breathe in moist air.  Take all medicine as directed by your caregiver.  Do not smoke.  Talk as little as possible (this includes whispering).  Write on paper instead of talking until your voice is back to normal.  Follow up with your caregiver if your condition has not improved after 10 days. SEEK MEDICAL CARE IF:   You have trouble breathing.  You cough up blood.  You have persistent fever.  You have increasing pain.  You have difficulty swallowing. MAKE SURE YOU:  Understand these instructions.  Will watch your condition.  Will get help right away if you are not doing well or get worse. Document Released: 03/27/2005 Document Revised: 06/19/2011 Document Reviewed: 06/02/2010 [facility] Patient Information 2014 [place], [place].      Neta Mends. Nicoya Friel M.D.

## 2013-02-02 ENCOUNTER — Other Ambulatory Visit: Payer: Self-pay | Admitting: Internal Medicine

## 2013-02-13 ENCOUNTER — Other Ambulatory Visit: Payer: Self-pay

## 2013-02-19 ENCOUNTER — Ambulatory Visit: Payer: Self-pay | Admitting: Obstetrics and Gynecology

## 2013-02-27 ENCOUNTER — Ambulatory Visit (INDEPENDENT_AMBULATORY_CARE_PROVIDER_SITE_OTHER): Payer: 59 | Admitting: Obstetrics and Gynecology

## 2013-02-27 ENCOUNTER — Encounter: Payer: Self-pay | Admitting: Obstetrics and Gynecology

## 2013-02-27 VITALS — BP 110/70 | HR 70 | Ht 61.0 in | Wt 158.0 lb

## 2013-02-27 DIAGNOSIS — M8580 Other specified disorders of bone density and structure, unspecified site: Secondary | ICD-10-CM

## 2013-02-27 DIAGNOSIS — Z01419 Encounter for gynecological examination (general) (routine) without abnormal findings: Secondary | ICD-10-CM

## 2013-02-27 DIAGNOSIS — M899 Disorder of bone, unspecified: Secondary | ICD-10-CM

## 2013-02-27 DIAGNOSIS — Z0000 Encounter for general adult medical examination without abnormal findings: Secondary | ICD-10-CM

## 2013-02-27 LAB — POCT URINALYSIS DIPSTICK
Bilirubin, UA: NEGATIVE
Glucose, UA: NEGATIVE

## 2013-02-27 MED ORDER — ESTROGENS, CONJUGATED 0.625 MG/GM VA CREA: 1.0000 | TOPICAL_CREAM | Freq: Every day | VAGINAL | Status: DC

## 2013-02-27 NOTE — Progress Notes (Signed)
Patient ID: Laura Brooks, female   DOB: Nov 18, 1950, 62 y.o.   MRN: [id number] GYNECOLOGY VISIT  PCP:   Berniece Andreas, MD  Referring provider:   HPI: 62 y.o.   Divorced  Caucasian  female   G2P2002 with Patient's last menstrual period was 04/10/2002.   here for   AEX. Leakage with coughing and sneezing.  Does not occur often.  Had a vaginal hysterectomy and prolapse repair - Dr. Tresa Res and Dr. Sherron Monday.   Using vaginal estrogen cream.   Hgb:    PCP Urine:  Trace RBC's (neg, work up in past per pt.)  No dysuria. No UTI in years.   GYNECOLOGIC HISTORY: Patient's last menstrual period was 04/10/2002. Sexually active:  yes Partner preference: female Contraception:  hysterectomy  Menopausal hormone therapy: Estrace Cream DES exposure:   no Blood transfusions:  no  Sexually transmitted diseases:   HSV  GYN Procedures:  Total Vaginal Hysterectomy, anterior colporrhaphy with dermal graft, posterior colporrhaphy  (Still has ovaries.) Mammogram:  06-19-12 QIO:NGEXB               Pap:   09-14-09 wnl:no HR HPV done History of abnormal pap smear:  no   OB History   Grav Para Term Preterm Abortions TAB SAB Ect Mult Living   2 2 2       2        LIFESTYLE: Exercise:   no            Tobacco:   no Alcohol:      1 drink per week Drug use:   no  OTHER HEALTH MAINTENANCE: Tetanus/TDap:   01-12-12 Gardisil:               n/a Influenza:             never Zostavax:             01-30-11  Bone density:     05/2011 Osteopenia right hip, T score - 2.2.   No FRAX.  Has taken Fosamax in the past.  Colonoscopy:    08-25-10 wnl:Dr. Lina Sar.  Next colonoscopy due 08/2020.  Cholesterol check:   PCP  Family History  Problem Relation Age of Onset  . Anemia Father   . Hypertension Father 74  . Diabetes Father   . Thyroid disease Sister   . Hypertension Mother 26  . Thyroid disease Mother   . Breast cancer Paternal Aunt   . Breast cancer Paternal Grandmother     Patient Active Problem List    Diagnosis Date Noted  . Visit for preventive health examination 10/30/2012  . Mild anemia 10/10/2011  . Preventative health care 06/08/2010  . Leukopenia 06/08/2010  . HYPOTHYROIDISM, POST-RADIATION 06/08/2010  . GOITER, MULTINODULAR 10/22/2009  . RASH AND OTHER NONSPECIFIC SKIN ERUPTION 07/20/2008  . HYPERLIPIDEMIA 04/08/2008  . LEG PAIN, LEFT 04/08/2008  . HYPERTRIGLYCERIDEMIA 05/17/2007  . HEMATURIA UNSPECIFIED 05/17/2007  . OSTEOPENIA 01/16/2007   Past Medical History  Diagnosis Date  . Osteopenia     dexa 2009   . Radiculopathy, lumbar region     L4L5  . Benign microscopic hematuria     neg urol eval in past  . 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 HYPERLIPIDEMIA 04/08/2008    Qualifier: Diagnosis of  By: Fabian Sharp MD, Neta Mends   . HEMATURIA UNSPECIFIED 05/17/2007    Qualifier: Diagnosis of  By: Lawernce Ion, CMA (AAMA), Bethann Berkshire STD (sexually  transmitted disease)     HSV  . Osteoporosis     --hip  . Urinary incontinence     with coughing and sneezing    Past Surgical History  Procedure Laterality Date  . Tonsillectomy    . Colonoscopy    . Total vaginal hysterectomy    . Bladder suspension  09/2010  . Wisdom tooth extraction      ALLERGIES: Review of patient's allergies indicates no known allergies.  Current Outpatient Prescriptions  Medication Sig Dispense Refill  . Ascorbic Acid (VITAMIN C) 1000 MG tablet Take 1,000 mg by mouth daily.        . Calcium Carbonate-Vitamin D (CALCIUM + D) 600-200 MG-UNIT TABS Take 1 capsule by mouth daily.        Marland Kitchen CRANBERRY EXTRACT PO Take 4,200 mg by mouth 2 (two) times daily.        . Cyanocobalamin (VITAMIN B 12 PO) Take 250 mg by mouth daily.        Marland Kitchen estradiol (ESTRACE) 0.1 MG/GM vaginal cream Place 2 g vaginally 2 (two) times a week.      . fish oil-omega-3 fatty acids 1000 MG capsule Take 1 g by mouth daily.        Marland Kitchen levothyroxine (SYNTHROID, LEVOTHROID) 75 MCG tablet TAKE ONE TABLET BY MOUTH ONE TIME DAILY   30  tablet  5  . Magnesium 100 MG CAPS Take 1 capsule by mouth daily.        . Multiple Vitamins-Minerals (MULTIVITAMIN WITH MINERALS) tablet Take 1 tablet by mouth daily.        . valACYclovir (VALTREX) 1000 MG tablet as needed.        No current facility-administered medications for this visit.     ROS:  Pertinent items are noted in HPI.  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    PHYSICAL EXAMINATION:    BP 110/70  Pulse 70  Ht 5\' 1"  (1.549 m)  Wt 158 lb (71.668 kg)  BMI 29.87 kg/m2  LMP 04/10/2002   Wt Readings from Last 3 Encounters:  02/27/13 158 lb (71.668 kg)  01/28/13 160 lb (72.576 kg)  10/10/11 152 lb (68.947 kg)     Ht Readings from Last 3 Encounters:  02/27/13 5\' 1"  (1.549 m)  10/10/11 5' 0.5" (1.537 m)  06/29/11 5' (1.524 m)    General appearance: alert, cooperative and appears stated age Head: Normocephalic, without obvious abnormality, atraumatic Neck: no adenopathy, supple, symmetrical, trachea midline and thyroid not enlarged, symmetric, no tenderness/mass/nodules Lungs: clear to auscultation bilaterally Breasts: Inspection negative, No nipple retraction or dimpling, No nipple discharge or bleeding, No axillary or supraclavicular adenopathy, Normal to palpation without dominant masses Heart: regular rate and rhythm Abdomen: soft, non-tender; no masses,  no organomegaly Extremities: extremities normal, atraumatic, no cyanosis or edema Skin: Skin color, texture, turgor normal. No rashes or lesions Lymph nodes: Cervical, supraclavicular, and axillary nodes normal. No abnormal inguinal nodes palpated Neurologic: Grossly normal  Pelvic: External genitalia:  no lesions              Urethra:  normal appearing urethra with no masses, tenderness or lesions              Bartholins and Skenes: normal                 Vagina: normal appearing vagina with normal color and discharge, no lesions              Cervix:  absent.               Pap and  high risk HPV testing done: no.            Bimanual Exam:  Uterus:   absent                                      Adnexa: normal adnexa in size, nontender and no masses                                      Rectovaginal: Confirms                                      Anus:  normal sphincter tone, no lesions  ASSESSMENT  Normal gynecologic exam. Status post TVH and prolapse repair. Atrophic vagina.  Osteopenia.   PLAN  Mammogram yearly.  Pap smear and high risk HPV testing not indicated.  Counseled on vaginal estrogen use.  Risks reviewed. Premarin cream.  See EPIC. Bone density for 2015. Return annually or prn   An After Visit Summary was printed and given to the patient.

## 2013-02-27 NOTE — Patient Instructions (Signed)

## 2013-03-04 ENCOUNTER — Telehealth: Payer: Self-pay | Admitting: Obstetrics and Gynecology

## 2013-03-04 NOTE — Telephone Encounter (Signed)
Pt states that premarin and estrace both cost $ 230.00 with her insurance. What other options do she have?

## 2013-03-04 NOTE — Telephone Encounter (Signed)
I spoke with her insurance company and they stated that Premarin was tier 3 and Estrace was tier 2, but patient has not met deductible at this time, hence, increased costs. I spoke with patient and explained what I had learned from her insurance company and [employer] and she wants to keep her current rx (she has paper copy to take to other pharmacies) and she will use discount card for premarin and research pharmacies and call back if chooses not to use premarin.

## 2013-04-18 ENCOUNTER — Ambulatory Visit (INDEPENDENT_AMBULATORY_CARE_PROVIDER_SITE_OTHER): Payer: 59 | Admitting: Family Medicine

## 2013-04-18 ENCOUNTER — Encounter: Payer: Self-pay | Admitting: Family Medicine

## 2013-04-18 VITALS — BP 120/80 | HR 60 | Temp 98.1°F

## 2013-04-18 DIAGNOSIS — R059 Cough, unspecified: Secondary | ICD-10-CM

## 2013-04-18 DIAGNOSIS — R05 Cough: Secondary | ICD-10-CM

## 2013-04-18 DIAGNOSIS — T148XXA Other injury of unspecified body region, initial encounter: Secondary | ICD-10-CM

## 2013-04-18 MED ORDER — BENZONATATE 100 MG PO CAPS: 100.0000 mg | ORAL_CAPSULE | Freq: Two times a day (BID) | ORAL | Status: DC | PRN

## 2013-04-18 MED ORDER — AZITHROMYCIN 250 MG PO TABS: ORAL_TABLET | ORAL | Status: DC

## 2013-04-18 NOTE — Patient Instructions (Signed)
-  As we discussed, we have prescribed a new medication for you at this appointment. We discussed the common and serious potential adverse effects of this medication and you can review these and more with the pharmacist when you pick up your medication.  Please follow the instructions for use carefully and notify us immediately if you have any problems taking this medication.  -heat and tylenol  -follow up with your doctor in 3 weeks or sooner as needed

## 2013-04-18 NOTE — Progress Notes (Signed)
Chief Complaint  Patient presents with  . pain in ribs with coughing    HPI:  Acute visit for cough: -started: had flu (positive test) at the end the December, did not get tamiflu, cough has persisted -symptoms:nasal congestion, sore throat, cough - ribs hurt from coughing and has some body aches persistent -denies:fever, SOB, NVD, tooth pain -sick contacts/travel/risks:no Ebola risks -Hx of: hx of laryngitis in october ROS: See pertinent positives and negatives per HPI.  Past Medical History  Diagnosis Date  . Osteopenia     dexa 2009   . Radiculopathy, lumbar region     L4L5  . Benign microscopic hematuria     neg urol eval in past  . 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 HYPERLIPIDEMIA 04/08/2008    Qualifier: Diagnosis of  By: Regis Bill MD, Standley Brooking   . HEMATURIA UNSPECIFIED 05/17/2007    Qualifier: Diagnosis of  By: Hulan Saas, CMA (AAMA), Quita Skye   . STD (sexually transmitted disease)     HSV  . Osteoporosis     --hip  . Urinary incontinence     with coughing and sneezing    Past Surgical History  Procedure Laterality Date  . Tonsillectomy    . Colonoscopy    . Total vaginal hysterectomy    . Bladder suspension  09/2010  . Wisdom tooth extraction      Family History  Problem Relation Age of Onset  . Anemia Father   . Hypertension Father 69  . Diabetes Father   . Thyroid disease Sister   . Hypertension Mother 37  . Thyroid disease Mother   . Breast cancer Paternal Aunt   . Breast cancer Paternal Grandmother     History   Social History  . Marital Status: Divorced    Spouse Name: N/A    Number of Children: N/A  . Years of Education: N/A   Occupational History  . [employer]   . ASSIST OF DIRECT FUND     Social History Main Topics  . Smoking status: Former Research scientist (life sciences)  . Smokeless tobacco: Never Used  . Alcohol Use: 0.5 oz/week    1 drink(s) per week  . Drug Use: No  . Sexual Activity: Yes    Partners: Male    Birth Control/  Protection: Surgical     Comment: TVH 2012   Other Topics Concern  . None   Social History Narrative   Divorced former Smoker   Works Administrator, [employer]  Pet fish and parrot.    Current outpatient prescriptions:Ascorbic Acid (VITAMIN C) 1000 MG tablet, Take 1,000 mg by mouth daily.  , Disp: , Rfl: ;  Calcium Carbonate-Vitamin D (CALCIUM + D) 600-200 MG-UNIT TABS, Take 1 capsule by mouth daily.  , Disp: , Rfl:  conjugated estrogens (PREMARIN) vaginal cream, Place 1 Applicatorful vaginally daily. Use 1/2 g vaginally every night at bed time for the first 2 weeks, then use 1/2 g vaginally two or three times per week as needed to maintain symptom relief., Disp: 60 g, Rfl: 3;  CRANBERRY EXTRACT PO, Take 4,200 mg by mouth 2 (two) times daily.  , Disp: , Rfl: ;  Cyanocobalamin (VITAMIN B 12 PO), Take 250 mg by mouth daily.  , Disp: , Rfl:  fish oil-omega-3 fatty acids 1000 MG capsule, Take 1 g by mouth daily.  , Disp: , Rfl: ;  levothyroxine (SYNTHROID, LEVOTHROID) 75 MCG tablet, TAKE ONE TABLET  BY MOUTH ONE TIME DAILY , Disp: 30 tablet, Rfl: 5;  Magnesium 100 MG CAPS, Take 1 capsule by mouth daily.  , Disp: , Rfl: ;  Multiple Vitamins-Minerals (MULTIVITAMIN WITH MINERALS) tablet, Take 1 tablet by mouth daily.  , Disp: , Rfl:  valACYclovir (VALTREX) 1000 MG tablet, as needed. , Disp: , Rfl: ;  azithromycin (ZITHROMAX) 250 MG tablet, 2 tabs on first day and then 1 tab daily for 4 more days, Disp: 6 tablet, Rfl: 0;  benzonatate (TESSALON) 100 MG capsule, Take 1 capsule (100 mg total) by mouth 2 (two) times daily as needed for cough., Disp: 20 capsule, Rfl: 0  EXAM:  Filed Vitals:   04/18/13 1554  BP: 120/80  Pulse: 60  Temp: 98.1 F (36.7 C)    Body mass index is 0.00 kg/(m^2).  GENERAL: vitals reviewed and listed above, alert, oriented, appears well hydrated and in no acute distress  HEENT: atraumatic, conjunttiva clear, no obvious abnormalities on inspection of external nose and  ears, normal appearance of ear canals and TMs, clear nasal congestion, mild post oropharyngeal erythema with PND, no tonsillar edema or exudate, no sinus TTP  NECK: no obvious masses on inspection  LUNGS: clear to auscultation bilaterally, no wheezes, rales or rhonchi, good air movement  CV: HRRR, no peripheral edema  MS: moves all extremities without noticeable abnormality - mild TTP in soft tissues under under wire of bra  PSYCH: pleasant and cooperative, no obvious depression or anxiety  ASSESSMENT AND PLAN:  Discussed the following assessment and plan:  Cough - Plan: azithromycin (ZITHROMAX) 250 MG tablet, benzonatate (TESSALON) 100 MG capsule  Contusion - Plan: azithromycin (ZITHROMAX) 250 MG tablet, benzonatate (TESSALON) 100 MG capsule  -We discussed potential etiologies, suspect mild CAP associated with recent influenzal illness and advised abx and cough medication, pain in soft tissues is right bewteen ribs and underwire and suspect is tender/contusion from coughing. Advised heat and tylenol. -We discussed treatment side effects, likely course, antibiotic, transmission, and signs of developing a serious illness. -follow up in 3 weeks to ensure improvement -of course, we advised to return or notify a doctor immediately if symptoms worsen or persist or new concerns arise.    Patient Instructions  -As we discussed, we have prescribed a new medication for you at this appointment. We discussed the common and serious potential adverse effects of this medication and you can review these and more with the pharmacist when you pick up your medication.  Please follow the instructions for use carefully and notify us immediately if you have any problems taking this medication.  -heat and tylenol  -follow up with your doctor in 3 weeks or sooner as needed     KIM, HANNAH R.

## 2013-04-18 NOTE — Progress Notes (Signed)
Pre visit review using our clinic review tool, if applicable. No additional management support is needed unless otherwise documented below in the visit note. 

## 2013-05-12 ENCOUNTER — Ambulatory Visit: Payer: 59 | Admitting: Internal Medicine

## 2013-06-24 LAB — HM MAMMOGRAPHY

## 2013-06-27 ENCOUNTER — Encounter: Payer: Self-pay | Admitting: Internal Medicine

## 2013-08-04 ENCOUNTER — Other Ambulatory Visit: Payer: Self-pay | Admitting: Internal Medicine

## 2013-10-31 ENCOUNTER — Other Ambulatory Visit (INDEPENDENT_AMBULATORY_CARE_PROVIDER_SITE_OTHER): Payer: 59

## 2013-10-31 DIAGNOSIS — Z0000 Encounter for general adult medical examination without abnormal findings: Secondary | ICD-10-CM

## 2013-10-31 LAB — CBC WITH DIFFERENTIAL/PLATELET
BASOS ABS: 0 10*3/uL (ref 0.0–0.1)
Basophils Relative: 0.6 % (ref 0.0–3.0)
EOS ABS: 0.1 10*3/uL (ref 0.0–0.7)
Eosinophils Relative: 1.5 % (ref 0.0–5.0)
HCT: 36.1 % (ref 36.0–46.0)
Hemoglobin: 12.2 g/dL (ref 12.0–15.0)
Lymphocytes Relative: 30.9 % (ref 12.0–46.0)
Lymphs Abs: 1.6 10*3/uL (ref 0.7–4.0)
MCHC: 33.7 g/dL (ref 30.0–36.0)
MCV: 93.9 fl (ref 78.0–100.0)
MONOS PCT: 7.9 % (ref 3.0–12.0)
Monocytes Absolute: 0.4 10*3/uL (ref 0.1–1.0)
NEUTROS PCT: 59.1 % (ref 43.0–77.0)
Neutro Abs: 3.1 10*3/uL (ref 1.4–7.7)
Platelets: 249 10*3/uL (ref 150.0–400.0)
RBC: 3.85 Mil/uL — ABNORMAL LOW (ref 3.87–5.11)
RDW: 12.9 % (ref 11.5–15.5)
WBC: 5.2 10*3/uL (ref 4.0–10.5)

## 2013-10-31 LAB — LIPID PANEL
CHOL/HDL RATIO: 5
Cholesterol: 196 mg/dL (ref 0–200)
HDL: 43.1 mg/dL (ref >39.00)
LDL CALC: 118 mg/dL — AB (ref 0–99)
NONHDL: 152.9
Triglycerides: 177 mg/dL — ABNORMAL HIGH (ref 0.0–149.0)
VLDL: 35.4 mg/dL (ref 0.0–40.0)

## 2013-10-31 LAB — TSH: TSH: 0.75 u[IU]/mL (ref 0.35–4.50)

## 2013-10-31 LAB — BASIC METABOLIC PANEL
BUN: 17 mg/dL (ref 6–23)
CALCIUM: 8.9 mg/dL (ref 8.4–10.5)
CO2: 30 meq/L (ref 19–32)
CREATININE: 0.8 mg/dL (ref 0.4–1.2)
Chloride: 106 mEq/L (ref 96–112)
GFR: 77.89 mL/min (ref >60.00)
Glucose, Bld: 90 mg/dL (ref 70–99)
Potassium: 3.9 mEq/L (ref 3.5–5.1)
SODIUM: 139 meq/L (ref 135–145)

## 2013-10-31 LAB — HEPATIC FUNCTION PANEL
ALT: 18 U/L (ref 0–35)
AST: 19 U/L (ref 0–37)
Albumin: 3.7 g/dL (ref 3.5–5.2)
Alkaline Phosphatase: 43 U/L (ref 39–117)
BILIRUBIN DIRECT: 0 mg/dL (ref 0.0–0.3)
Total Bilirubin: 0.6 mg/dL (ref 0.2–1.2)
Total Protein: 6.5 g/dL (ref 6.0–8.3)

## 2013-11-07 ENCOUNTER — Ambulatory Visit (INDEPENDENT_AMBULATORY_CARE_PROVIDER_SITE_OTHER): Payer: 59 | Admitting: Internal Medicine

## 2013-11-07 ENCOUNTER — Encounter: Payer: Self-pay | Admitting: Internal Medicine

## 2013-11-07 VITALS — BP 120/84 | HR 60 | Temp 98.0°F | Ht 60.5 in | Wt 162.0 lb

## 2013-11-07 DIAGNOSIS — E890 Postprocedural hypothyroidism: Secondary | ICD-10-CM

## 2013-11-07 DIAGNOSIS — Z0000 Encounter for general adult medical examination without abnormal findings: Secondary | ICD-10-CM

## 2013-11-07 DIAGNOSIS — E785 Hyperlipidemia, unspecified: Secondary | ICD-10-CM

## 2013-11-07 LAB — POCT URINALYSIS DIPSTICK
Bilirubin, UA: NEGATIVE
Glucose, UA: NEGATIVE
Ketones, UA: NEGATIVE
Leukocytes, UA: NEGATIVE
NITRITE UA: NEGATIVE
PROTEIN UA: NEGATIVE
RBC UA: NEGATIVE
Spec Grav, UA: 1.015
UROBILINOGEN UA: 0.2
pH, UA: 6

## 2013-11-07 MED ORDER — LEVOTHYROXINE SODIUM 75 MCG PO TABS: ORAL_TABLET | ORAL | Status: DC

## 2013-11-07 NOTE — Patient Instructions (Addendum)
No change thyroid med   Healthy lifestyle includes : At least 150 minutes of exercise weeks  , weight at healthy levels, which is usually   BMI 19-25. Avoid trans fats and processed foods;  Increase fresh fruits and veges to 5 servings per day. And avoid sweet beverages including tea and juice. Mediterranean diet with olive oil and nuts have been noted to be heart and brain healthy . Avoid tobacco products . Limit  alcohol to  7 per week for women and 14 servings for men.  Get adequate sleep .

## 2013-11-07 NOTE — Progress Notes (Signed)
Pre visit review using our clinic review tool, if applicable. No additional management support is needed unless otherwise documented below in the visit note. 

## 2013-11-07 NOTE — Progress Notes (Signed)
Chief Complaint  Patient presents with  . Annual Exam    HPI: Patient comes in today for Preventive Health Care visit  No major change in health status since last visit . Working many hours  No fractures no exercise premarin cream is very expensive  Has HSA   Health Maintenance  Topic Date Due  . Pap Smear  09/14/2012  . Influenza Vaccine  11/08/2013  . Mammogram  06/25/2015  . Colonoscopy  08/24/2020  . Tetanus/tdap  01/11/2022  . Zostavax  Completed   Health Maintenance Review LIFESTYLE:  Exercise:  Thinking about it  Tobacco/ETS:no Alcohol: per day  Sugar beverages:no Sleep: 5 hours  Drug use: no Bone density: 2013 Colonoscopy: 2012   ROS:  GEN/ HEENT: No fever, significant weight changes sweats headaches vision problems hearing changes, CV/ PULM; No chest pain shortness of breath cough, syncope,edema  change in exercise tolerance. GI /GU: No adominal pain, vomiting, change in bowel habits. No blood in the stool. No significant GU symptoms. Hx of intermittent microphem in urine for years no sx no hematuria using est cream but too expensive  SKIN/HEME: ,no acute skin rashes suspicious lesions or bleeding. No lymphadenopathy, nodules, masses.  NEURO/ PSYCH:  No neurologic signs such as weakness numbness. No depression anxiety. IMM/ Allergy: No unusual infections.  Allergy .   Itchy all over at times   Happening for years   Day.  No rash or progression   .   REST of 12 system review negative except as per HPI   Past Medical History  Diagnosis Date  . Osteopenia     dexa 2009   . Radiculopathy, lumbar region     L4L5  . Benign microscopic hematuria     neg urol eval in past  . 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 HYPERLIPIDEMIA 04/08/2008    Qualifier: Diagnosis of  By: Regis Bill MD, Standley Brooking   . HEMATURIA UNSPECIFIED 05/17/2007    Qualifier: Diagnosis of  By: Hulan Saas, CMA (AAMA), Quita Skye   . STD (sexually transmitted disease)     HSV  .  Osteoporosis     --hip  . Urinary incontinence     with coughing and sneezing  . Leukopenia 06/08/2010    Very mild   Has thyroid disease    Poss related to rx     No sx  Will repeat when convenient.     Past Surgical History  Procedure Laterality Date  . Tonsillectomy    . Colonoscopy    . Total vaginal hysterectomy    . Bladder suspension  09/2010  . Wisdom tooth extraction       Family History  Problem Relation Age of Onset  . Anemia Father   . Hypertension Father 90  . Diabetes Father   . Thyroid disease Sister   . Hypertension Mother 64  . Thyroid disease Mother   . Breast cancer Paternal Aunt   . Breast cancer Paternal Grandmother     History   Social History  . Marital Status: Divorced    Spouse Name: N/A    Number of Children: N/A  . Years of Education: N/A   Occupational History  . [employer]   . ASSIST OF DIRECT FUND     Social History Main Topics  . Smoking status: Former Research scientist (life sciences)  . Smokeless tobacco: Never Used  . Alcohol Use: 0.5 oz/week    1 drink(s) per week  .  Drug Use: No  . Sexual Activity: Yes    Partners: Male    Birth Control/ Protection: Surgical     Comment: TVH 2012   Other Topics Concern  . None   Social History Narrative   Divorced former Smoker   Works Administrator, [employer]  Pet fish and parrot.    Outpatient Encounter Prescriptions as of 11/07/2013  Medication Sig  . Ascorbic Acid (VITAMIN C) 1000 MG tablet Take 1,000 mg by mouth daily.    . Calcium Carbonate-Vitamin D (CALCIUM + D) 600-200 MG-UNIT TABS Take 1 capsule by mouth daily.    Marland Kitchen conjugated estrogens (PREMARIN) vaginal cream Place 1 Applicatorful vaginally daily. Use 1/2 g vaginally every night at bed time for the first 2 weeks, then use 1/2 g vaginally two or three times per week as needed to maintain symptom relief.  Marland Kitchen CRANBERRY EXTRACT PO Take 4,200 mg by mouth 2 (two) times daily.    . Cyanocobalamin (VITAMIN B 12 PO) Take 250 mg by mouth  daily.    . fish oil-omega-3 fatty acids 1000 MG capsule Take 1 g by mouth daily.    Marland Kitchen levothyroxine (SYNTHROID, LEVOTHROID) 75 MCG tablet TAKE ONE TABLET BY MOUTH ONE TIME DAILY  . Magnesium 100 MG CAPS Take 1 capsule by mouth daily.    . Multiple Vitamins-Minerals (MULTIVITAMIN WITH MINERALS) tablet Take 1 tablet by mouth daily.    . valACYclovir (VALTREX) 1000 MG tablet as needed.   . [DISCONTINUED] levothyroxine (SYNTHROID, LEVOTHROID) 75 MCG tablet TAKE ONE TABLET BY MOUTH ONE TIME DAILY   . [DISCONTINUED] azithromycin (ZITHROMAX) 250 MG tablet 2 tabs on first day and then 1 tab daily for 4 more days  . [DISCONTINUED] benzonatate (TESSALON) 100 MG capsule Take 1 capsule (100 mg total) by mouth 2 (two) times daily as needed for cough.  . [DISCONTINUED] levothyroxine (SYNTHROID, LEVOTHROID) 75 MCG tablet TAKE ONE TABLET BY MOUTH ONE TIME DAILY    EXAM:  BP 120/84  Pulse 60  Temp(Src) 98 F (36.7 C) (Oral)  Ht 5' 0.5" (1.537 m)  Wt 162 lb (73.483 kg)  BMI 31.11 kg/m2  LMP 04/10/2002  Body mass index is 31.11 kg/(m^2).  Physical Exam: Vital signs reviewed GDJ:MEQA is a well-developed well-nourished alert cooperative    who appearsr stated age in no acute distress.  HEENT: normocephalic atraumatic , Eyes: PERRL EOM's full, conjunctiva clear, Nares: paten,t no deformity discharge or tenderness., Ears: no deformity EAC's clear TMs with normal landmarks. Mouth: clear OP, no lesions, edema.  Moist mucous membranes. Dentition in adequate repair. NECK: supple without masses, thyromegaly or bruits. CHEST/PULM:  Clear to auscultation and percussion breath sounds equal no wheeze , rales or rhonchi. No chest wall deformities or tenderness. CV: PMI is nondisplaced, S1 S2 no gallops, murmurs, rubs. Peripheral pulses are full without delay.No JVD .  ABDOMEN: Bowel sounds normal nontender  No guard or rebound, no hepato splenomegal no CVA tenderness.  No hernia. Extremtities:  No clubbing cyanosis  or edema, no acute joint swelling or redness no focal atrophy NEURO:  Oriented x3, cranial nerves 3-12 appear to be intact, no obvious focal weakness,gait within normal limits no abnormal reflexes or asymmetrical SKIN: No acute rashes normal turgor, color, no bruising or petechiae. PSYCH: Oriented, good eye contact, no obvious depression anxiety, cognition and judgment appear normal. LN: no cervical axillary inguinal adenopathy  Lab Results  Component Value Date   WBC 5.2 10/31/2013   HGB 12.2  10/31/2013   HCT 36.1 10/31/2013   PLT 249.0 10/31/2013   GLUCOSE 90 10/31/2013   CHOL 196 10/31/2013   TRIG 177.0* 10/31/2013   HDL 43.10 10/31/2013   LDLDIRECT 158.3 10/28/2012   LDLCALC 118* 10/31/2013   ALT 18 10/31/2013   AST 19 10/31/2013   NA 139 10/31/2013   K 3.9 10/31/2013   CL 106 10/31/2013   CREATININE 0.8 10/31/2013   BUN 17 10/31/2013   CO2 30 10/31/2013   TSH 0.75 10/31/2013   INR 1.08 09/20/2010   Wt Readings from Last 3 Encounters:  11/07/13 162 lb (73.483 kg)  02/27/13 158 lb (71.668 kg)  01/28/13 160 lb (72.576 kg)    ASSESSMENT AND PLAN:  Discussed the following assessment and plan:  Visit for preventive health examination - utd can get dexa next year under routine screening  - Plan: POC Urinalysis Dipstick  HYPOTHYROIDISM, POST-RADIATION - same dose med   HYPERLIPIDEMIA - lsi exercise would help   Patient Care Team: Burnis Medin, MD as PCP - General Peri Maris, MD (Obstetrics and Gynecology) Clarisa Fling, MD as Referring Physician (Internal Medicine) Patient Instructions  No change thyroid med   Healthy lifestyle includes : At least 150 minutes of exercise weeks  , weight at healthy levels, which is usually   BMI 19-25. Avoid trans fats and processed foods;  Increase fresh fruits and veges to 5 servings per day. And avoid sweet beverages including tea and juice. Mediterranean diet with olive oil and nuts have been noted to be heart and brain healthy . Avoid  tobacco products . Limit  alcohol to  7 per week for women and 14 servings for men.  Get adequate sleep .       Standley Brooking. Rael Yo M.D.

## 2013-11-17 ENCOUNTER — Telehealth: Payer: Self-pay | Admitting: Obstetrics and Gynecology

## 2013-11-17 NOTE — Telephone Encounter (Signed)
Patient wants to know what alternatives she has to premarin cream. Said one tube cost her over 200. Already used discount cards said she cant uses anymore.

## 2013-11-17 NOTE — Telephone Encounter (Signed)
Prescription options:  Vagifem or Estring.  Nonprescription options:  Vaginal vitamin E suppositories (can buy on [employer]), or use of cooking oils like olive oil or Canola oil.  Patient can contact her insurance company and see if the Vagifem or Estring are options financially.

## 2013-11-17 NOTE — Telephone Encounter (Signed)
Spoke with patient. Patient states that she is unable to afford premarin rx. Patient states that even with using the savings card it is only taking off 15 dollars. "I just can not afford to use it any more. I am using less that I am supposed to because I cant afford to refill it." Patient states that estrace was just as expensive. Patient would like to try something else that is more cost effective. "Even something that isn't a prescription." Advised patient would send a message over to Dr.Silva with request and give patient a call back with best alternative options. Patient is agreeable.

## 2013-11-18 NOTE — Telephone Encounter (Signed)
Spoke with patient. Advised of message as seen below from [place]. Patient would like to try using cooking oils first. Patient will also check the price for Vagifem and Estring with her insurance. If the cooking oils do not work as well and the prescriptions are in her price range she will call back.  Routing to provider for final review. Patient agreeable to disposition. Will close encounter

## 2013-12-04 ENCOUNTER — Encounter: Payer: Self-pay | Admitting: Obstetrics and Gynecology

## 2014-01-08 ENCOUNTER — Ambulatory Visit (INDEPENDENT_AMBULATORY_CARE_PROVIDER_SITE_OTHER): Payer: 59 | Admitting: Podiatry

## 2014-01-08 ENCOUNTER — Ambulatory Visit (INDEPENDENT_AMBULATORY_CARE_PROVIDER_SITE_OTHER): Payer: 59

## 2014-01-08 ENCOUNTER — Encounter: Payer: Self-pay | Admitting: Podiatry

## 2014-01-08 VITALS — BP 124/72 | HR 75 | Resp 17

## 2014-01-08 DIAGNOSIS — R52 Pain, unspecified: Secondary | ICD-10-CM

## 2014-01-08 DIAGNOSIS — M779 Enthesopathy, unspecified: Secondary | ICD-10-CM

## 2014-01-08 DIAGNOSIS — M2021 Hallux rigidus, right foot: Secondary | ICD-10-CM

## 2014-01-08 MED ORDER — TRIAMCINOLONE ACETONIDE 10 MG/ML IJ SUSP
10.0000 mg | Freq: Once | INTRAMUSCULAR | Status: AC
  Administered 2014-01-08: 10 mg

## 2014-01-08 NOTE — Progress Notes (Signed)
Subjective:     Patient ID: Laura Brooks, female   DOB: 08-27-1950, 63 y.o.   MRN: [id number]  Toe Pain    patient presents stating I'm getting pain around my big toe joint on my left foot over right foot and it's been occurring for about 2 months. It's intermittent pain but it does seem to have some consistent aching   Review of Systems  All other systems reviewed and are negative.      Objective:   Physical Exam  Nursing note and vitals reviewed. Constitutional: She is oriented to person, place, and time.  Cardiovascular: Intact distal pulses.   Musculoskeletal: Normal range of motion.  Neurological: She is oriented to person, place, and time.  Skin: Skin is warm.   neurovascular status is intact with muscle strength adequate and range of motion of subtalar midtarsal joint within normal limits. Moderate reduction of motion first MPJ left over right with no crepitus noted in the joint upon movement. 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 Explained condition to patient and did careful injection around the first MPJ left 3 mg Kenalog 5 mg Xylocaine and we will monitor condition and someday he may require a different type of orthotic or possibly surgery if symptoms were to progress

## 2014-01-08 NOTE — Progress Notes (Signed)
   Subjective:    Patient ID: Laura Brooks, female    DOB: 07-16-1950, 63 y.o.   MRN: [id number]  HPI Pt presents with bilateral big toe pain at MPJ, she c/o stiffness and pain since august, worsens when wearing closed toe shoes, pain is intermittent with swelling   Review of Systems  All other systems reviewed and are negative.      Objective:   Physical Exam        Assessment & Plan:

## 2014-02-09 ENCOUNTER — Encounter: Payer: Self-pay | Admitting: Podiatry

## 2014-02-25 ENCOUNTER — Other Ambulatory Visit: Payer: Self-pay

## 2014-02-25 MED ORDER — VALACYCLOVIR HCL 500 MG PO TABS: 500.0000 mg | ORAL_TABLET | Freq: Two times a day (BID) | ORAL | Status: DC

## 2014-02-25 NOTE — Telephone Encounter (Signed)
Incoming Refill Request from [facility]: Valtrex 500mg   Last AEX:02/27/13 Last Refill: Next AEX:03/16/14  In pt chart, it states Valtrex 1000mg . Pharmacy is requesting 500mg  twice daily.   Please Advise

## 2014-03-16 ENCOUNTER — Ambulatory Visit: Payer: 59 | Admitting: Obstetrics and Gynecology

## 2014-05-15 ENCOUNTER — Ambulatory Visit (INDEPENDENT_AMBULATORY_CARE_PROVIDER_SITE_OTHER): Payer: 59 | Admitting: Nurse Practitioner

## 2014-05-15 ENCOUNTER — Encounter: Payer: Self-pay | Admitting: Nurse Practitioner

## 2014-05-15 VITALS — BP 124/80 | HR 84 | Ht 60.5 in | Wt 153.0 lb

## 2014-05-15 DIAGNOSIS — M8580 Other specified disorders of bone density and structure, unspecified site: Secondary | ICD-10-CM

## 2014-05-15 DIAGNOSIS — Z01419 Encounter for gynecological examination (general) (routine) without abnormal findings: Secondary | ICD-10-CM

## 2014-05-15 DIAGNOSIS — Z0000 Encounter for general adult medical examination without abnormal findings: Secondary | ICD-10-CM

## 2014-05-15 NOTE — Progress Notes (Signed)
Patient ID: Laura Brooks, female   DOB: 10/17/1950, 64 y.o.   MRN: [id number] 64 y.o. G2P2002 Divorced  Caucasian Fe here for annual exam.  No outbreaks in years with HSV.  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  Same partner for 14 years. Not SA.  Vaginal estrogen cream is too expensive.  Patient's last menstrual period was 04/10/2002.          Sexually active: No.  The current method of family planning is none and status post hysterectomy.    Exercising: No.  The patient does not participate in regular exercise at present. Smoker:  no  Health Maintenance: Pap:  09/14/09, negative  MMG:  06/24/13, Bi-Rads 1:  Negative  Colonoscopy:  08/25/10, normal, repeat in 10 years BMD:   06/06/11, T-Score:  Rad -0.9/Spine -0.5/Left -2.1/Right -2.2 TDaP:  01/12/12 Shingles: 01/30/11 Labs:  Dr. Regis Bill 10/2013 in EPIC   reports that she has quit smoking. She has never used smokeless tobacco. She reports that she drinks about 0.5 oz of alcohol per week. She reports that she does not use illicit drugs.  Past Medical History  Diagnosis Date  . Osteopenia     dexa 2009   . Radiculopathy, lumbar region     L4L5  . Benign microscopic hematuria     neg urol eval in past  . 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 HYPERLIPIDEMIA 04/08/2008    Qualifier: Diagnosis of  By: Regis Bill MD, Standley Brooking   . HEMATURIA UNSPECIFIED 05/17/2007    Qualifier: Diagnosis of  By: Hulan Saas, CMA (AAMA), Quita Skye   . STD (sexually transmitted disease)     HSV  . Osteoporosis     --hip  . Urinary incontinence     with coughing and sneezing  . Leukopenia 06/08/2010    Very mild   Has thyroid disease    Poss related to rx     No sx  Will repeat when convenient.      Past Surgical History  Procedure Laterality Date  . Tonsillectomy    . Colonoscopy    . Total vaginal hysterectomy    . Bladder suspension  09/2010  . Wisdom tooth extraction      Current Outpatient Prescriptions  Medication Sig Dispense Refill  . Ascorbic  Acid (VITAMIN C) 1000 MG tablet Take 1,000 mg by mouth daily.      . Calcium Carbonate-Vitamin D (CALCIUM + D) 600-200 MG-UNIT TABS Take 1 capsule by mouth daily.      Marland Kitchen CRANBERRY EXTRACT PO Take 4,200 mg by mouth 2 (two) times daily.      . Cyanocobalamin (VITAMIN B 12 PO) Take 250 mg by mouth daily.      . fish oil-omega-3 fatty acids 1000 MG capsule Take 1 g by mouth daily.      Marland Kitchen levothyroxine (SYNTHROID, LEVOTHROID) 75 MCG tablet TAKE ONE TABLET BY MOUTH ONE TIME DAILY 30 tablet 12  . Magnesium 100 MG CAPS Take 1 capsule by mouth daily.      . Multiple Vitamins-Minerals (MULTIVITAMIN WITH MINERALS) tablet Take 1 tablet by mouth daily.      . valACYclovir (VALTREX) 500 MG tablet Take 1 tablet (500 mg total) by mouth 2 (two) times daily. 30 tablet 1   No current facility-administered medications for this visit.    Family History  Problem Relation Age of Onset  . Anemia Father   . Hypertension Father 60  . Diabetes Father   .  Thyroid disease Sister   . Hypertension Mother 62  . Thyroid disease Mother   . Breast cancer Paternal Aunt   . Breast cancer Paternal Grandmother     ROS:  Pertinent items are noted in HPI.  Otherwise, a comprehensive ROS was negative.  Exam:   BP 124/80 mmHg  Pulse 84  Ht 5' 0.5" (1.537 m)  Wt 153 lb (69.4 kg)  BMI 29.38 kg/m2  LMP 04/10/2002 Height: 5' 0.5" (153.7 cm) Ht Readings from Last 3 Encounters:  05/15/14 5' 0.5" (1.537 m)  11/07/13 5' 0.5" (1.537 m)  02/27/13 5\' 1"  (1.549 m)    General appearance: alert, cooperative and appears stated age Head: Normocephalic, without obvious abnormality, atraumatic Neck: no adenopathy, supple, symmetrical, trachea midline and thyroid normal to inspection and palpation Lungs: clear to auscultation bilaterally Breasts: normal appearance, no masses or tenderness Heart: regular rate and rhythm Abdomen: soft, non-tender; no masses,  no organomegaly Extremities: extremities normal, atraumatic, no cyanosis  or edema Skin: Skin color, texture, turgor normal. No rashes or lesions Lymph nodes: Cervical, supraclavicular, and axillary nodes normal. No abnormal inguinal nodes palpated Neurologic: Grossly normal   Pelvic: External genitalia:  no lesions              Urethra:  normal appearing urethra with no masses, tenderness or lesions              Bartholin's and Skene's: normal                 Vagina: normal appearing vagina with normal color and discharge, no lesions              Cervix: absent              Pap taken: No. Bimanual Exam:  Uterus:  uterus absent              Adnexa: no mass, fullness, tenderness               Rectovaginal: Confirms               Anus:  normal sphincter tone, no lesions  Chaperone present:  no  A:  Well Woman with normal exam  Status post TVH and prolapse repair 09/2010  Atrophic vagina.   Osteopenia.    P:   Reviewed health and wellness pertinent to exam  Pap smear taken today  Mammogram is due 06/2014  Note faxed for BMD  Counseled on breast self exam, mammography screening, adequate intake of calcium and vitamin D, diet and exercise return annually or prn  An After Visit Summary was printed and given to the patient.

## 2014-05-15 NOTE — Patient Instructions (Addendum)

## 2014-05-17 NOTE — Progress Notes (Signed)
Encounter reviewed by Dr. Theo Reither Silva.  

## 2014-06-15 ENCOUNTER — Telehealth: Payer: Self-pay | Admitting: Nurse Practitioner

## 2014-06-15 NOTE — Telephone Encounter (Signed)
Please let pt. Know that BMD on 06/08/14 shows a T Score: spine at -0.5; left hip neck -1.9; right hip neck at -2.3; and left radius at -0.4.  The lowest is at the right hip neck which is still in the Osteopenic range.  Comparison study from 06/06/11 shows no statistically significant change in the spine or hips.  The FRAX score for major fracture in 10 years is 11.5 % (goal is < 20%); the FRAX score for the hip in 10 years  is 2.0 % (goal is <3%).   While some bone loss is normal and expectant in postmenopausal women we do not want to see further loss.  She must start a walking program at least 3 times a week and do upper body exercise with weights.  Continue calcium and Vit D.  She has [facility] and we do not want to see this getting worse.

## 2014-06-17 NOTE — Telephone Encounter (Signed)
Pt notified of results.  She voices understanding and is agreeable with plan. She will try to start walking more.

## 2014-07-14 LAB — HM MAMMOGRAPHY

## 2014-07-15 ENCOUNTER — Encounter: Payer: Self-pay | Admitting: Family Medicine

## 2014-11-25 ENCOUNTER — Other Ambulatory Visit: Payer: Self-pay | Admitting: Internal Medicine

## 2014-11-26 NOTE — Telephone Encounter (Signed)
Sent to the pharmacy by e-scribe. 

## 2015-01-23 ENCOUNTER — Other Ambulatory Visit: Payer: Self-pay | Admitting: Internal Medicine

## 2015-01-26 ENCOUNTER — Other Ambulatory Visit (INDEPENDENT_AMBULATORY_CARE_PROVIDER_SITE_OTHER): Payer: 59

## 2015-01-26 DIAGNOSIS — R7989 Other specified abnormal findings of blood chemistry: Secondary | ICD-10-CM | POA: Diagnosis not present

## 2015-01-26 DIAGNOSIS — Z0000 Encounter for general adult medical examination without abnormal findings: Secondary | ICD-10-CM | POA: Diagnosis not present

## 2015-01-26 LAB — CBC WITH DIFFERENTIAL/PLATELET
Basophils Absolute: 0 10*3/uL (ref 0.0–0.1)
Basophils Relative: 0.8 % (ref 0.0–3.0)
Eosinophils Absolute: 0.1 10*3/uL (ref 0.0–0.7)
Eosinophils Relative: 2.1 % (ref 0.0–5.0)
HCT: 36.5 % (ref 36.0–46.0)
Hemoglobin: 12.4 g/dL (ref 12.0–15.0)
Lymphocytes Relative: 34 % (ref 12.0–46.0)
Lymphs Abs: 1.8 10*3/uL (ref 0.7–4.0)
MCHC: 33.9 g/dL (ref 30.0–36.0)
MCV: 91.2 fl (ref 78.0–100.0)
Monocytes Absolute: 0.5 10*3/uL (ref 0.1–1.0)
Monocytes Relative: 10.3 % (ref 3.0–12.0)
Neutro Abs: 2.7 10*3/uL (ref 1.4–7.7)
Neutrophils Relative %: 52.8 % (ref 43.0–77.0)
Platelets: 232 10*3/uL (ref 150.0–400.0)
RBC: 4 Mil/uL (ref 3.87–5.11)
RDW: 12.9 % (ref 11.5–15.5)
WBC: 5.2 10*3/uL (ref 4.0–10.5)

## 2015-01-26 LAB — BASIC METABOLIC PANEL
BUN: 21 mg/dL (ref 6–23)
CO2: 28 meq/L (ref 19–32)
CREATININE: 0.85 mg/dL (ref 0.40–1.20)
Calcium: 9.1 mg/dL (ref 8.4–10.5)
Chloride: 107 mEq/L (ref 96–112)
GFR: 71.3 mL/min (ref >60.00)
Glucose, Bld: 101 mg/dL — ABNORMAL HIGH (ref 70–99)
POTASSIUM: 3.7 meq/L (ref 3.5–5.1)
Sodium: 141 mEq/L (ref 135–145)

## 2015-01-26 LAB — HEPATIC FUNCTION PANEL
ALT: 22 U/L (ref 0–35)
AST: 19 U/L (ref 0–37)
Albumin: 3.8 g/dL (ref 3.5–5.2)
Alkaline Phosphatase: 46 U/L (ref 39–117)
Bilirubin, Direct: 0 mg/dL (ref 0.0–0.3)
Total Bilirubin: 0.6 mg/dL (ref 0.2–1.2)
Total Protein: 6.5 g/dL (ref 6.0–8.3)

## 2015-01-26 LAB — TSH: TSH: 1.18 u[IU]/mL (ref 0.35–4.50)

## 2015-01-26 LAB — LIPID PANEL
CHOLESTEROL: 219 mg/dL — AB (ref 0–200)
HDL: 45.1 mg/dL (ref >39.00)
NONHDL: 173.77
TRIGLYCERIDES: 223 mg/dL — AB (ref 0.0–149.0)
Total CHOL/HDL Ratio: 5
VLDL: 44.6 mg/dL — ABNORMAL HIGH (ref 0.0–40.0)

## 2015-01-26 LAB — LDL CHOLESTEROL, DIRECT: Direct LDL: 129 mg/dL

## 2015-01-26 NOTE — Telephone Encounter (Signed)
Sent to the pharmacy by e-scribe.  Pt has upcoming cpx on 02/02/15

## 2015-02-02 ENCOUNTER — Encounter: Payer: Self-pay | Admitting: Internal Medicine

## 2015-02-02 ENCOUNTER — Ambulatory Visit (INDEPENDENT_AMBULATORY_CARE_PROVIDER_SITE_OTHER): Payer: 59 | Admitting: Internal Medicine

## 2015-02-02 VITALS — BP 144/84 | Temp 97.6°F | Ht 60.0 in | Wt 158.9 lb

## 2015-02-02 DIAGNOSIS — E890 Postprocedural hypothyroidism: Secondary | ICD-10-CM

## 2015-02-02 DIAGNOSIS — Z23 Encounter for immunization: Secondary | ICD-10-CM | POA: Diagnosis not present

## 2015-02-02 DIAGNOSIS — Z0000 Encounter for general adult medical examination without abnormal findings: Secondary | ICD-10-CM | POA: Diagnosis not present

## 2015-02-02 DIAGNOSIS — IMO0001 Reserved for inherently not codable concepts without codable children: Secondary | ICD-10-CM

## 2015-02-02 DIAGNOSIS — R030 Elevated blood-pressure reading, without diagnosis of hypertension: Secondary | ICD-10-CM | POA: Diagnosis not present

## 2015-02-02 DIAGNOSIS — E785 Hyperlipidemia, unspecified: Secondary | ICD-10-CM | POA: Diagnosis not present

## 2015-02-02 NOTE — Progress Notes (Signed)
Pre visit review using our clinic review tool, if applicable. No additional management support is needed unless otherwise documented below in the visit note.  Chief Complaint  Patient presents with  . Annual Exam    HPI: Patient  Laura Brooks  64 y.o. 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 Lots of job stress but otherwise no acute changes in health. Thyroid med every day.  Health Maintenance  Topic Date Due  . Hepatitis C Screening  11-Jun-1950  . PAP SMEAR  09/14/2012  . HIV Screening  02/02/2016 (Originally 11/17/1965)  . INFLUENZA VACCINE  11/09/2015  . PNA vac Low Risk Adult (2 of 2 - PPSV23) 02/02/2016  . MAMMOGRAM  07/13/2016  . COLONOSCOPY  08/24/2020  . TETANUS/TDAP  01/11/2022  . DEXA SCAN  Completed  . ZOSTAVAX  Completed  doanted blood .  Regualrty  Health Maintenance Review LIFESTYLE:  Exercise:   no a lot  Tobacco/ETS:no Alcohol: once  In a while Sugar beverages: no Sleep: 6 hours maybe  Drug use: no   ROS:  GEN/ HEENT: No fever, significant weight changes sweats headaches vision problems hearing changes, CV/ PULM; No chest pain shortness of breath cough, syncope,edema  change in exercise tolerance. GI /GU: No adominal pain, vomiting, change in bowel habits. No blood in the stool. No significant GU symptoms. SKIN/HEME: ,no acute skin rashes suspicious lesions or bleeding. No lymphadenopathy, nodules, masses.  NEURO/ PSYCH:  No neurologic signs such as weakness numbness. No depression anxiety. IMM/ Allergy: No unusual infections.  Allergy .   REST of 12 system review negative except as per HPI   Past Medical History  Diagnosis Date  . Osteopenia     dexa 2009   . Radiculopathy, lumbar region     L4L5  . Benign microscopic hematuria     neg urol eval in past  . 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 HYPERLIPIDEMIA 04/08/2008    Qualifier: Diagnosis of  By:  Regis Bill MD, Standley Brooking   . HEMATURIA UNSPECIFIED 05/17/2007    Qualifier: Diagnosis of  By: Hulan Saas, CMA (AAMA), Quita Skye   . STD (sexually transmitted disease)     HSV  . Osteoporosis     --hip  . Urinary incontinence     with coughing and sneezing  . Leukopenia 06/08/2010    Very mild   Has thyroid disease    Poss related to rx     No sx  Will repeat when convenient.      Past Surgical History  Procedure Laterality Date  . Tonsillectomy    . Colonoscopy    . Total vaginal hysterectomy    . Bladder suspension  09/2010  . Wisdom tooth extraction      Family History  Problem Relation Age of Onset  . Anemia Father   . Hypertension Father 61  . Diabetes Father   . Thyroid disease Sister   . Hypertension Mother 30  . Thyroid disease Mother   . Breast cancer Paternal Aunt   . Breast cancer Paternal Grandmother     Social History   Social History  . Marital Status: Divorced    Spouse Name: N/A  . Number of Children: N/A  . Years of Education: N/A   Occupational History  . [place]   . ASSIST OF [employer]  Social History Main Topics  . Smoking status: Former Research scientist (life sciences)  . Smokeless tobacco: Never Used  . Alcohol Use: 0.5 oz/week    1 drink(s) per week  . Drug Use: No  . Sexual Activity:    Partners: Male    Birth Control/ Protection: Surgical     Comment: [facility] 2012   Other Topics Concern  . None   Social History Narrative   Divorced former Smoker   Works Administrator, [employer]  Pet fish and parrot.    Outpatient Prescriptions Prior to Visit  Medication Sig Dispense Refill  . Ascorbic Acid (VITAMIN C) 1000 MG tablet Take 1,000 mg by mouth daily.      . Calcium Carbonate-Vitamin D (CALCIUM + D) 600-200 MG-UNIT TABS Take 1 capsule by mouth daily.      Marland Kitchen CRANBERRY EXTRACT PO Take 4,200 mg by mouth 2 (two) times daily.      . Cyanocobalamin (VITAMIN B 12 PO) Take 250 mg by mouth daily.      . fish oil-omega-3 fatty acids 1000 MG capsule  Take 1 g by mouth daily.      Marland Kitchen levothyroxine (SYNTHROID, LEVOTHROID) 75 MCG tablet TAKE ONE TABLET BY MOUTH ONE TIME DAILY 30 tablet 0  . Magnesium 100 MG CAPS Take 1 capsule by mouth daily.      . Multiple Vitamins-Minerals (MULTIVITAMIN WITH MINERALS) tablet Take 1 tablet by mouth daily.      . valACYclovir (VALTREX) 500 MG tablet Take 1 tablet (500 mg total) by mouth 2 (two) times daily. (Patient not taking: Reported on 02/02/2015) 30 tablet 1   No facility-administered medications prior to visit.     EXAM:  BP 144/84 mmHg  Temp(Src) 97.6 F (36.4 C) (Oral)  Ht 5' (1.524 m)  Wt 158 lb 14.4 oz (72.077 kg)  BMI 31.03 kg/m2  LMP 04/10/2002  Body mass index is 31.03 kg/(m^2).  Physical Exam: Vital signs reviewed ASN:KNLZ is a well-developed well-nourished alert cooperative    who appearsr stated age in no acute distress.  HEENT: normocephalic atraumatic , Eyes: PERRL EOM's full, conjunctiva clear, Nares: paten,t no deformity discharge or tenderness., Ears: no deformity EAC's clear TMs with normal landmarks. Mouth: clear OP, no lesions, edema.  Moist mucous membranes. Dentition in adequate repair. NECK: supple without masses, thyromegaly or bruits.Breast no nodules or discharge  CHEST/PULM:  Clear to auscultation and percussion breath sounds equal no wheeze , rales or rhonchi. No chest wall deformities or tenderness. CV: PMI is nondisplaced, S1 S2 no gallops, murmurs, rubs. Peripheral pulses are full without delay.No JVD .  ABDOMEN: Bowel sounds normal nontender  No guard or rebound, no hepato splenomegal no CVA tenderness.  No hernia. Extremtities:  No clubbing cyanosis or edema, no acute joint swelling or redness no focal atrophy NEURO:  Oriented x3, cranial nerves 3-12 appear to be intact, no obvious focal weakness,gait within normal limits no abnormal reflexes or asymmetrical SKIN: No acute rashes normal turgor, color, no bruising or petechiae. PSYCH: Oriented, good eye contact,  no obvious depression anxiety, cognition and judgment appear normal. LN: no cervical axillary inguinal adenopathy  Lab Results  Component Value Date   WBC 5.2 01/26/2015   HGB 12.4 01/26/2015   HCT 36.5 01/26/2015   PLT 232.0 01/26/2015   GLUCOSE 101* 01/26/2015   CHOL 219* 01/26/2015   TRIG 223.0* 01/26/2015   HDL 45.10 01/26/2015   LDLDIRECT 129.0 01/26/2015   LDLCALC 118* 10/31/2013   ALT 22  01/26/2015   AST 19 01/26/2015   NA 141 01/26/2015   K 3.7 01/26/2015   CL 107 01/26/2015   CREATININE 0.85 01/26/2015   BUN 21 01/26/2015   CO2 28 01/26/2015   TSH 1.18 01/26/2015   INR 1.08 09/20/2010    ASSESSMENT AND PLAN:  Discussed the following assessment and plan:  Visit for preventive health examination  Elevated BP - confirm in healthy range  fu if not  HLD (hyperlipidemia) - lsi  Need for vaccination with 13-polyvalent pneumococcal conjugate vaccine - Plan: Pneumococcal conjugate vaccine 13-valent IM  Hypothyroidism, 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 Reviewed lifestyle interventions in regard to the cholesterol triglycerides not interested at this time in seeing a nutritionist is aware of her interventions that are necessary.  No need for HIV and hepatitis C testing she donates blood on a regular basis. And she is not high risk.  Patient Care Team: Burnis Medin, MD as PCP - General Clarisa Fling, MD as Referring Physician (Internal Medicine) Kem Boroughs, FNP as Nurse Practitioner [facility]) Patient Instructions  Take blood pressure readings  .To ensure below 140/90   .Send in readings     If elevated and fu  Let us know if you want [place] to refer to nutritionist about your elevated  triglycerides     Health Maintenance, Female Adopting a healthy lifestyle and getting preventive care can go a long way to promote health and wellness. Talk with your  health care provider about what schedule of regular examinations is right for you. This is a good chance for you to check in with your provider about disease prevention and staying healthy. In between checkups, there are plenty of things you can do on your own. Experts have done a lot of research about which lifestyle changes and preventive measures are most likely to keep you healthy. Ask your health care provider for more information. WEIGHT AND DIET  Eat a healthy diet  Be sure to include plenty of vegetables, fruits, low-fat dairy products, and lean protein.  Do not eat a lot of foods high in solid fats, added sugars, or salt.  Get regular exercise. This is one of the most important things you can do for your health.  Most adults should exercise for at least 150 minutes each week. The exercise should increase your heart rate and make you sweat (moderate-intensity exercise).  Most adults should also do strengthening exercises at least twice a week. This is in addition to the moderate-intensity exercise.  Maintain a healthy weight  Body mass index (BMI) is a measurement that can be used to identify possible weight problems. It estimates body fat based on height and weight. Your health care provider can help determine your BMI and help you achieve or maintain a healthy weight.  For females 48 years of age and older:   A BMI below 18.5 is considered underweight.  A BMI of 18.5 to 24.9 is normal.  A BMI of 25 to 29.9 is considered overweight.  A BMI of 30 and above is considered obese.  Watch levels of cholesterol and blood lipids  You should start having your blood tested for lipids and cholesterol at 65 years of age, then have this test every 5 years.  You may need to have your cholesterol levels checked more often if:  Your lipid or cholesterol levels  are high.  You are older than 65 years of age.  You are at high risk for heart disease.  CANCER SCREENING   Lung  Cancer  Lung cancer screening is recommended for adults 31-69 years old who are at high risk for lung cancer because of a history of smoking.  A yearly low-dose CT scan of the lungs is recommended for people who:  Currently smoke.  Have quit within the past 15 years.  Have at least a 30-pack-year history of smoking. A pack year is smoking an average of one pack of cigarettes a day for 1 year.  Yearly screening should continue until it has been 15 years since you quit.  Yearly screening should stop if you develop a health problem that would prevent you from having lung cancer treatment.  Breast Cancer  Practice breast self-awareness. This means understanding how your breasts normally appear and feel.  It also means doing regular breast self-exams. Let your health care provider know about any changes, no matter how small.  If you are in your 20s or 30s, you should have a clinical breast exam (CBE) by a health care provider every 1-3 years as part of a regular health exam.  If you are 45 or older, have a CBE every year. Also consider having a breast X-ray (mammogram) every year.  If you have a family history of breast cancer, talk to your health care provider about genetic screening.  If you are at high risk for breast cancer, talk to your health care provider about having an MRI and a mammogram every year.  Breast cancer gene (BRCA) assessment is recommended for women who have family members with BRCA-related cancers. BRCA-related cancers include:  Breast.  Ovarian.  Tubal.  Peritoneal cancers.  Results of the assessment will determine the need for genetic counseling and BRCA1 and BRCA2 testing. Cervical Cancer Your health care provider may recommend that you be screened regularly for cancer of the pelvic organs (ovaries, uterus, and vagina). This screening involves a pelvic examination, including checking for microscopic changes to the surface of your cervix (Pap test). You  may be encouraged to have this screening done every 3 years, beginning at age 52.  For women ages 75-65, health care providers may recommend pelvic exams and Pap testing every 3 years, or they may recommend the Pap and pelvic exam, combined with testing for human papilloma virus (HPV), every 5 years. Some types of HPV increase your risk of cervical cancer. Testing for HPV may also be done on women of any age with unclear Pap test results.  Other health care providers may not recommend any screening for nonpregnant women who are considered low risk for pelvic cancer and who do not have symptoms. Ask your health care provider if a screening pelvic exam is right for you.  If you have had past treatment for cervical cancer or a condition that could lead to cancer, you need Pap tests and screening for cancer for at least 20 years after your treatment. If Pap tests have been discontinued, your risk factors (such as having a new sexual partner) need to be reassessed to determine if screening should resume. Some women have medical problems that increase the chance of getting cervical cancer. In these cases, your health care provider may recommend more frequent screening and Pap tests. Colorectal Cancer  This type of cancer can be detected and often prevented.  Routine colorectal cancer screening usually begins at 65 years of age and continues through  65 years of age.  Your health care provider may recommend screening at an earlier age if you have risk factors for colon cancer.  Your health care provider may also recommend using home test kits to check for hidden blood in the stool.  A small camera at the end of a tube can be used to examine your colon directly (sigmoidoscopy or colonoscopy). This is done to check for the earliest forms of colorectal cancer.  Routine screening usually begins at age 17.  Direct examination of the colon should be repeated every 5-10 years through 65 years of age. However,  you may need to be screened more often if early forms of precancerous polyps or small growths are found. Skin Cancer  Check your skin from head to toe regularly.  Tell your health care provider about any new moles or changes in moles, especially if there is a change in a mole's shape or color.  Also tell your health care provider if you have a mole that is larger than the size of a pencil eraser.  Always use sunscreen. Apply sunscreen liberally and repeatedly throughout the day.  Protect yourself by wearing long sleeves, pants, a wide-brimmed hat, and sunglasses whenever you are outside. HEART DISEASE, DIABETES, AND HIGH BLOOD PRESSURE   High blood pressure causes heart disease and increases the risk of stroke. High blood pressure is more likely to develop in:  People who have blood pressure in the high end of the normal range (130-139/85-89 mm Hg).  People who are overweight or obese.  People who are African American.  If you are 21-74 years of age, have your blood pressure checked every 3-5 years. If you are 71 years of age or older, have your blood pressure checked every year. You should have your blood pressure measured twice--once when you are at a hospital or clinic, and once when you are not at a hospital or clinic. Record the average of the two measurements. To check your blood pressure when you are not at a hospital or clinic, you can use:  An automated blood pressure machine at a pharmacy.  A home blood pressure monitor.  If you are between 91 years and 7 years old, ask your health care provider if you should take aspirin to prevent strokes.  Have regular diabetes screenings. This involves taking a blood sample to check your fasting blood sugar level.  If you are at a normal weight and have a low risk for diabetes, have this test once every three years after 65 years of age.  If you are overweight and have a high risk for diabetes, consider being tested at a younger age  or more often. PREVENTING INFECTION  Hepatitis B  If you have a higher risk for hepatitis B, you should be screened for this virus. You are considered at high risk for hepatitis B if:  You were born in a country where hepatitis B is common. Ask your health care provider which countries are considered high risk.  Your parents were born in a high-risk country, and you have not been immunized against hepatitis B (hepatitis B vaccine).  You have HIV or AIDS.  You use needles to inject street drugs.  You live with someone who has hepatitis B.  You have had sex with someone who has hepatitis B.  You get hemodialysis treatment.  You take certain medicines for conditions, including cancer, organ transplantation, and autoimmune conditions. Hepatitis C  Blood testing is recommended for:  Everyone  born from [birth year] through 1966.  Anyone with known risk factors for hepatitis C. Sexually transmitted infections (STIs)  You should be screened for sexually transmitted infections (STIs) including gonorrhea and chlamydia if:  You are sexually active and are younger than 65 years of age.  You are older than 65 years of age and your health care provider tells you that you are at risk for this type of infection.  Your sexual activity has changed since you were last screened and you are at an increased risk for chlamydia or gonorrhea. Ask your health care provider if you are at risk.  If you do not have HIV, but are at risk, it may be recommended that you take a prescription medicine daily to prevent HIV infection. This is called pre-exposure prophylaxis (PrEP). You are considered at risk if:  You are sexually active and do not regularly use condoms or know the HIV status of your partner(s).  You take drugs by injection.  You are sexually active with a partner who has HIV. Talk with your health care provider about whether you are at high risk of being infected with HIV. If you choose to begin  PrEP, you should first be tested for HIV. You should then be tested every 3 months for as long as you are taking PrEP.  PREGNANCY   If you are premenopausal and you may become pregnant, ask your health care provider about preconception counseling.  If you may become pregnant, take 400 to 800 micrograms (mcg) of folic acid every day.  If you want to prevent pregnancy, talk to your health care provider about birth control (contraception). OSTEOPOROSIS AND MENOPAUSE   Osteoporosis is a disease in which the bones lose minerals and strength with aging. This can result in serious bone fractures. Your risk for osteoporosis can be identified using a bone density scan.  If you are 30 years of age or older, or if you are at risk for osteoporosis and fractures, ask your health care provider if you should be screened.  Ask your health care provider whether you should take a calcium or vitamin D supplement to lower your risk for osteoporosis.  Menopause may have certain physical symptoms and risks.  Hormone replacement therapy may reduce some of these symptoms and risks. Talk to your health care provider about whether hormone replacement therapy is right for you.  HOME CARE INSTRUCTIONS   Schedule regular health, dental, and eye exams.  Stay current with your immunizations.   Do not use any tobacco products including cigarettes, chewing tobacco, or electronic cigarettes.  If you are pregnant, do not drink alcohol.  If you are breastfeeding, limit how much and how often you drink alcohol.  Limit alcohol intake to no more than 1 drink per day for nonpregnant women. One drink equals 12 ounces of beer, 5 ounces of wine, or 1 ounces of hard liquor.  Do not use street drugs.  Do not share needles.  Ask your health care provider for help if you need support or information about quitting drugs.  Tell your health care provider if you often feel depressed.  Tell your health care provider if you  have ever been abused or do not feel safe at home.   This information is not intended to replace advice given to you by your health care provider. Make sure you discuss any questions you have with your health care provider.   Document Released: 10/10/2010 Document Revised: 04/17/2014 Document Reviewed: 02/26/2013 Elsevier Interactive Patient  Education 2016 [place] Rielly Corlett M.D.

## 2015-02-02 NOTE — Patient Instructions (Signed)
Take blood pressure readings  .To ensure below 140/90   .Send in readings     If elevated and fu  Let us know if you want [place] to refer to nutritionist about your elevated  triglycerides     Health Maintenance, Female Adopting a healthy lifestyle and getting preventive care can go a long way to promote health and wellness. Talk with your health care provider about what schedule of regular examinations is right for you. This is a good chance for you to check in with your provider about disease prevention and staying healthy. In between checkups, there are plenty of things you can do on your own. Experts have done a lot of research about which lifestyle changes and preventive measures are most likely to keep you healthy. Ask your health care provider for more information. WEIGHT AND DIET  Eat a healthy diet  Be sure to include plenty of vegetables, fruits, low-fat dairy products, and lean protein.  Do not eat a lot of foods high in solid fats, added sugars, or salt.  Get regular exercise. This is one of the most important things you can do for your health.  Most adults should exercise for at least 150 minutes each week. The exercise should increase your heart rate and make you sweat (moderate-intensity exercise).  Most adults should also do strengthening exercises at least twice a week. This is in addition to the moderate-intensity exercise.  Maintain a healthy weight  Body mass index (BMI) is a measurement that can be used to identify possible weight problems. It estimates body fat based on height and weight. Your health care provider can help determine your BMI and help you achieve or maintain a healthy weight.  For females 31 years of age and older:   A BMI below 18.5 is considered underweight.  A BMI of 18.5 to 24.9 is normal.  A BMI of 25 to 29.9 is considered overweight.  A BMI of 30 and above is considered obese.  Watch levels of cholesterol and blood lipids  You should start  having your blood tested for lipids and cholesterol at 65 years of age, then have this test every 5 years.  You may need to have your cholesterol levels checked more often if:  Your lipid or cholesterol levels are high.  You are older than 65 years of age.  You are at high risk for heart disease.  CANCER SCREENING   Lung Cancer  Lung cancer screening is recommended for adults 14-43 years old who are at high risk for lung cancer because of a history of smoking.  A yearly low-dose CT scan of the lungs is recommended for people who:  Currently smoke.  Have quit within the past 15 years.  Have at least a 30-pack-year history of smoking. A pack year is smoking an average of one pack of cigarettes a day for 1 year.  Yearly screening should continue until it has been 15 years since you quit.  Yearly screening should stop if you develop a health problem that would prevent you from having lung cancer treatment.  Breast Cancer  Practice breast self-awareness. This means understanding how your breasts normally appear and feel.  It also means doing regular breast self-exams. Let your health care provider know about any changes, no matter how small.  If you are in your 20s or 30s, you should have a clinical breast exam (CBE) by a health care provider every 1-3 years as part of a regular health exam.  If you are 40 or older, have a CBE every year. Also consider having a breast X-ray (mammogram) every year.  If you have a family history of breast cancer, talk to your health care provider about genetic screening.  If you are at high risk for breast cancer, talk to your health care provider about having an MRI and a mammogram every year.  Breast cancer gene (BRCA) assessment is recommended for women who have family members with BRCA-related cancers. BRCA-related cancers include:  Breast.  Ovarian.  Tubal.  Peritoneal cancers.  Results of the assessment will determine the need for  genetic counseling and BRCA1 and BRCA2 testing. Cervical Cancer Your health care provider may recommend that you be screened regularly for cancer of the pelvic organs (ovaries, uterus, and vagina). This screening involves a pelvic examination, including checking for microscopic changes to the surface of your cervix (Pap test). You may be encouraged to have this screening done every 3 years, beginning at age 58.  For women ages 11-65, health care providers may recommend pelvic exams and Pap testing every 3 years, or they may recommend the Pap and pelvic exam, combined with testing for human papilloma virus (HPV), every 5 years. Some types of HPV increase your risk of cervical cancer. Testing for HPV may also be done on women of any age with unclear Pap test results.  Other health care providers may not recommend any screening for nonpregnant women who are considered low risk for pelvic cancer and who do not have symptoms. Ask your health care provider if a screening pelvic exam is right for you.  If you have had past treatment for cervical cancer or a condition that could lead to cancer, you need Pap tests and screening for cancer for at least 20 years after your treatment. If Pap tests have been discontinued, your risk factors (such as having a new sexual partner) need to be reassessed to determine if screening should resume. Some women have medical problems that increase the chance of getting cervical cancer. In these cases, your health care provider may recommend more frequent screening and Pap tests. Colorectal Cancer  This type of cancer can be detected and often prevented.  Routine colorectal cancer screening usually begins at 65 years of age and continues through 65 years of age.  Your health care provider may recommend screening at an earlier age if you have risk factors for colon cancer.  Your health care provider may also recommend using home test kits to check for hidden blood in the  stool.  A small camera at the end of a tube can be used to examine your colon directly (sigmoidoscopy or colonoscopy). This is done to check for the earliest forms of colorectal cancer.  Routine screening usually begins at age 9.  Direct examination of the colon should be repeated every 5-10 years through 65 years of age. However, you may need to be screened more often if early forms of precancerous polyps or small growths are found. Skin Cancer  Check your skin from head to toe regularly.  Tell your health care provider about any new moles or changes in moles, especially if there is a change in a mole's shape or color.  Also tell your health care provider if you have a mole that is larger than the size of a pencil eraser.  Always use sunscreen. Apply sunscreen liberally and repeatedly throughout the day.  Protect yourself by wearing long sleeves, pants, a wide-brimmed hat, and sunglasses whenever you  are outside. HEART DISEASE, DIABETES, AND HIGH BLOOD PRESSURE   High blood pressure causes heart disease and increases the risk of stroke. High blood pressure is more likely to develop in:  People who have blood pressure in the high end of the normal range (130-139/85-89 mm Hg).  People who are overweight or obese.  People who are African American.  If you are 18-39 years of age, have your blood pressure checked every 3-5 years. If you are 40 years of age or older, have your blood pressure checked every year. You should have your blood pressure measured twice--once when you are at a hospital or clinic, and once when you are not at a hospital or clinic. Record the average of the two measurements. To check your blood pressure when you are not at a hospital or clinic, you can use:  An automated blood pressure machine at a pharmacy.  A home blood pressure monitor.  If you are between 55 years and 79 years old, ask your health care provider if you should take aspirin to prevent  strokes.  Have regular diabetes screenings. This involves taking a blood sample to check your fasting blood sugar level.  If you are at a normal weight and have a low risk for diabetes, have this test once every three years after 65 years of age.  If you are overweight and have a high risk for diabetes, consider being tested at a younger age or more often. PREVENTING INFECTION  Hepatitis B  If you have a higher risk for hepatitis B, you should be screened for this virus. You are considered at high risk for hepatitis B if:  You were born in a country where hepatitis B is common. Ask your health care provider which countries are considered high risk.  Your parents were born in a high-risk country, and you have not been immunized against hepatitis B (hepatitis B vaccine).  You have HIV or AIDS.  You use needles to inject street drugs.  You live with someone who has hepatitis B.  You have had sex with someone who has hepatitis B.  You get hemodialysis treatment.  You take certain medicines for conditions, including cancer, organ transplantation, and autoimmune conditions. Hepatitis C  Blood testing is recommended for:  Everyone born from 1945 through 1965.  Anyone with known risk factors for hepatitis C. Sexually transmitted infections (STIs)  You should be screened for sexually transmitted infections (STIs) including gonorrhea and chlamydia if:  You are sexually active and are younger than 65 years of age.  You are older than 65 years of age and your health care provider tells you that you are at risk for this type of infection.  Your sexual activity has changed since you were last screened and you are at an increased risk for chlamydia or gonorrhea. Ask your health care provider if you are at risk.  If you do not have HIV, but are at risk, it may be recommended that you take a prescription medicine daily to prevent HIV infection. This is called pre-exposure prophylaxis  (PrEP). You are considered at risk if:  You are sexually active and do not regularly use condoms or know the HIV status of your partner(s).  You take drugs by injection.  You are sexually active with a partner who has HIV. Talk with your health care provider about whether you are at high risk of being infected with HIV. If you choose to begin PrEP, you should first be tested for   HIV. You should then be tested every 3 months for as long as you are taking PrEP.  PREGNANCY   If you are premenopausal and you may become pregnant, ask your health care provider about preconception counseling.  If you may become pregnant, take 400 to 800 micrograms (mcg) of folic acid every day.  If you want to prevent pregnancy, talk to your health care provider about birth control (contraception). OSTEOPOROSIS AND MENOPAUSE   Osteoporosis is a disease in which the bones lose minerals and strength with aging. This can result in serious bone fractures. Your risk for osteoporosis can be identified using a bone density scan.  If you are 74 years of age or older, or if you are at risk for osteoporosis and fractures, ask your health care provider if you should be screened.  Ask your health care provider whether you should take a calcium or vitamin D supplement to lower your risk for osteoporosis.  Menopause may have certain physical symptoms and risks.  Hormone replacement therapy may reduce some of these symptoms and risks. Talk to your health care provider about whether hormone replacement therapy is right for you.  HOME CARE INSTRUCTIONS   Schedule regular health, dental, and eye exams.  Stay current with your immunizations.   Do not use any tobacco products including cigarettes, chewing tobacco, or electronic cigarettes.  If you are pregnant, do not drink alcohol.  If you are breastfeeding, limit how much and how often you drink alcohol.  Limit alcohol intake to no more than 1 drink per day for  nonpregnant women. One drink equals 12 ounces of beer, 5 ounces of wine, or 1 ounces of hard liquor.  Do not use street drugs.  Do not share needles.  Ask your health care provider for help if you need support or information about quitting drugs.  Tell your health care provider if you often feel depressed.  Tell your health care provider if you have ever been abused or do not feel safe at home.   This information is not intended to replace advice given to you by your health care provider. Make sure you discuss any questions you have with your health care provider.   Document Released: 10/10/2010 Document Revised: 04/17/2014 Document Reviewed: 02/26/2013 Elsevier Interactive Patient Education [employer].

## 2015-02-09 ENCOUNTER — Telehealth: Payer: Self-pay | Admitting: Internal Medicine

## 2015-02-09 NOTE — Telephone Encounter (Signed)
Pt only has one bp reading for yesterday 152/82. Pt will check her bp daily and record the reading and contact dr Regis Bill next week.

## 2015-02-09 NOTE — Telephone Encounter (Signed)
Noted.  Will hold message for 1 week and contact the pt.

## 2015-02-15 NOTE — Telephone Encounter (Signed)
Left a message for a return call.

## 2015-02-15 NOTE — Telephone Encounter (Signed)
Pt has checke dher bp and readings as follows: 10/31  152 80 11/1  152/80 11/2  148/80 11/3  148/80 11/4  138/80  Pt did not check over the weekend. Pt ordered a bp machine and joined a gym. Pt states she will be traveling after 11/17 and has events every day at school. If you need pt to come in, she may could work in. Pt would like to know what you would have her do.

## 2015-02-15 NOTE — Telephone Encounter (Signed)
  Can begin lisinopril.  10 mg per day disp # 90 then  1 po qd continue to monitor and ROV in 2-3 months

## 2015-02-16 MED ORDER — LISINOPRIL 10 MG PO TABS: 10.0000 mg | ORAL_TABLET | Freq: Every day | ORAL | Status: DC

## 2015-02-16 NOTE — Telephone Encounter (Signed)
Spoke to the pt.  She has made a follow up appt with [facility] on 05/10/15 and she will pick up her prescription after work.

## 2015-02-24 ENCOUNTER — Other Ambulatory Visit: Payer: Self-pay | Admitting: Internal Medicine

## 2015-02-26 NOTE — Telephone Encounter (Signed)
Sent to the pharmacy by e-scribe. 

## 2015-04-14 ENCOUNTER — Other Ambulatory Visit: Payer: Self-pay | Admitting: Internal Medicine

## 2015-04-14 NOTE — Telephone Encounter (Signed)
Sent to the pharmacy by e-scribe. 

## 2015-05-10 ENCOUNTER — Telehealth: Payer: Self-pay | Admitting: Family Medicine

## 2015-05-10 ENCOUNTER — Ambulatory Visit (INDEPENDENT_AMBULATORY_CARE_PROVIDER_SITE_OTHER): Payer: BLUE CROSS/BLUE SHIELD | Admitting: Internal Medicine

## 2015-05-10 ENCOUNTER — Encounter: Payer: Self-pay | Admitting: Internal Medicine

## 2015-05-10 VITALS — BP 144/84 | Temp 98.0°F | Ht 60.0 in | Wt 157.0 lb

## 2015-05-10 DIAGNOSIS — R030 Elevated blood-pressure reading, without diagnosis of hypertension: Secondary | ICD-10-CM

## 2015-05-10 DIAGNOSIS — I10 Essential (primary) hypertension: Secondary | ICD-10-CM

## 2015-05-10 DIAGNOSIS — Z79899 Other long term (current) drug therapy: Secondary | ICD-10-CM

## 2015-05-10 NOTE — Telephone Encounter (Signed)
Spoke to the pt.  Cough comes and goes.  Is not a daily occurrence.  Would like to continue taking lisinopril because it is $0.63 a month. Blood pressures have been 130s/80s.  Would like to know if she needs to come back.  Stated she will call if bps increase. Please advise.  Thanks!

## 2015-05-10 NOTE — Telephone Encounter (Signed)
Laura Medin, MD at 05/10/2015 7:36 AM    Status: Signed      Expand All Collapse All    patient had to leave before being seen  Has been lisinopril and had developed a cough  Plan on changing medication And then fu in about a month   Please have patient stop the lisinopril and begin losartan 50 mg one a day disp 90 No refills .  ROV in a month after changing medication

## 2015-05-10 NOTE — Telephone Encounter (Signed)
Ok to stay on lisinopril but we could try 20 mg per day ( still inexpensive ) rx 90  i ftolerated   If bp too low below 110 and dizzy then can go back to half dose   ROV still in 1-2 months  Will need to check  Bmp labs pre visit  Dx hypertension   Lab Results  Component Value Date   WBC 5.2 01/26/2015   HGB 12.4 01/26/2015   HCT 36.5 01/26/2015   PLT 232.0 01/26/2015   GLUCOSE 101* 01/26/2015   CHOL 219* 01/26/2015   TRIG 223.0* 01/26/2015   HDL 45.10 01/26/2015   LDLDIRECT 129.0 01/26/2015   LDLCALC 118* 10/31/2013   ALT 22 01/26/2015   AST 19 01/26/2015   NA 141 01/26/2015   K 3.7 01/26/2015   CL 107 01/26/2015   CREATININE 0.85 01/26/2015   BUN 21 01/26/2015   CO2 28 01/26/2015   TSH 1.18 01/26/2015   INR 1.08 09/20/2010

## 2015-05-10 NOTE — Progress Notes (Signed)
patient had to leave before being seen  Has been  lisinopril and had developed a cough  Plan on changing medication  And then fu in about a month   Please have patient stop the lisinopril and begin losartan 50 mg one a day disp 90   No refills .  ROV in a month after changing  medication

## 2015-05-11 ENCOUNTER — Other Ambulatory Visit: Payer: Self-pay | Admitting: Family Medicine

## 2015-05-11 DIAGNOSIS — I10 Essential (primary) hypertension: Secondary | ICD-10-CM

## 2015-05-11 MED ORDER — LISINOPRIL 20 MG PO TABS: 20.0000 mg | ORAL_TABLET | Freq: Every day | ORAL | Status: DC

## 2015-05-11 NOTE — Telephone Encounter (Signed)
Pt notified to start taking 20 mg daily.  She will monitor her bp at home.  If below 110 than will take half tab.  Transferred to scheduling to make follow up and lab appt.  Lab order placed in the system. Will call when she needs 20 mg sent to the pharmacy.

## 2015-05-19 ENCOUNTER — Telehealth: Payer: Self-pay | Admitting: Nurse Practitioner

## 2015-05-19 ENCOUNTER — Ambulatory Visit: Payer: 59 | Admitting: Nurse Practitioner

## 2015-05-19 NOTE — Telephone Encounter (Signed)
Patient rescheduled appointment today because she is sick with a stomach virus. No dnka fee charge.

## 2015-05-25 ENCOUNTER — Telehealth: Payer: Self-pay | Admitting: Internal Medicine

## 2015-05-25 MED ORDER — LISINOPRIL 20 MG PO TABS: 20.0000 mg | ORAL_TABLET | Freq: Every day | ORAL | Status: DC

## 2015-05-25 NOTE — Telephone Encounter (Signed)
Sent to the pharmacy by e-scribe. 

## 2015-05-25 NOTE — Telephone Encounter (Signed)
Pharmacy call to say that because of the increase they need a new script lisinopril (PRINIVIL,ZESTRIL) 20 MG tablet   [facility] [address]

## 2015-05-26 ENCOUNTER — Ambulatory Visit (INDEPENDENT_AMBULATORY_CARE_PROVIDER_SITE_OTHER): Payer: BLUE CROSS/BLUE SHIELD | Admitting: Family Medicine

## 2015-05-26 ENCOUNTER — Telehealth: Payer: Self-pay | Admitting: Nurse Practitioner

## 2015-05-26 ENCOUNTER — Ambulatory Visit: Payer: 59 | Admitting: Nurse Practitioner

## 2015-05-26 VITALS — BP 140/90 | HR 96 | Temp 99.6°F | Ht 60.0 in | Wt 160.0 lb

## 2015-05-26 DIAGNOSIS — R509 Fever, unspecified: Secondary | ICD-10-CM

## 2015-05-26 DIAGNOSIS — J069 Acute upper respiratory infection, unspecified: Secondary | ICD-10-CM | POA: Diagnosis not present

## 2015-05-26 DIAGNOSIS — B9789 Other viral agents as the cause of diseases classified elsewhere: Principal | ICD-10-CM

## 2015-05-26 NOTE — Telephone Encounter (Signed)
Patient called to cancel appointment for today due her being sick and not wanting to spread it to our office. Rescheduled patient to 06/22/15 at 1:45 with Ms. Patty.

## 2015-05-26 NOTE — Progress Notes (Signed)
   Subjective:    Patient ID: Laura Brooks, female    DOB: [birth year], 65 y.o.   MRN: [id number]  HPI Acute visit. Onset this past Monday of nasal congestion and cough. No headaches. Mild body aches. She denies any nausea, vomiting, or diarrhea. Denies sore throat.  She works at the [employer] and lots of people there have been sick recently. She did have flu vaccine earlier this year  Past Medical History  Diagnosis Date  . Osteopenia     dexa 2009   . Radiculopathy, lumbar region     L4L5  . Benign microscopic hematuria     neg urol eval in past  . 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 HYPERLIPIDEMIA 04/08/2008    Qualifier: Diagnosis of  By: Regis Bill MD, Standley Brooking   . HEMATURIA UNSPECIFIED 05/17/2007    Qualifier: Diagnosis of  By: Hulan Saas, CMA (AAMA), Quita Skye   . STD (sexually transmitted disease)     HSV  . Osteoporosis     --hip  . Urinary incontinence     with coughing and sneezing  . Leukopenia 06/08/2010    Very mild   Has thyroid disease    Poss related to rx     No sx  Will repeat when convenient.     Past Surgical History  Procedure Laterality Date  . Tonsillectomy    . Colonoscopy    . Total vaginal hysterectomy    . Bladder suspension  09/2010  . Wisdom tooth extraction      reports that she has quit smoking. She has never used smokeless tobacco. She reports that she drinks about 0.5 oz of alcohol per week. She reports that she does not use illicit drugs. family history includes Anemia in her father; Breast cancer in her paternal aunt and paternal grandmother; Diabetes in her father; Hypertension (age of onset: 16) in her father and mother; Thyroid disease in her mother and sister. Allergies  Allergen Reactions  . Codeine Nausea Only      Review of Systems  Constitutional: Positive for fatigue.  HENT: Positive for congestion and rhinorrhea.   Respiratory: Positive for cough.        Objective:   Physical Exam    Constitutional: She appears well-developed and well-nourished.  HENT:  Right Ear: External ear normal.  Mouth/Throat: Oropharynx is clear and moist.  Moderate cerumen left canal. TM not visualized  Neck: Neck supple.  Cardiovascular: Normal rate and regular rhythm.   Pulmonary/Chest: Breath sounds normal. No respiratory distress. She has no wheezes. She has no rales.  Lymphadenopathy:    She has no cervical adenopathy.          Assessment & Plan:  Viral URI with cough. Influenza screen negative. Treat symptomatically. Continue Mucinex. Follow-up as needed.

## 2015-05-26 NOTE — Progress Notes (Signed)
Pre visit review using our clinic review tool, if applicable. No additional management support is needed unless otherwise documented below in the visit note. 

## 2015-05-26 NOTE — Patient Instructions (Signed)
Drink plenty of fluids Continue with Mucinex Get plenty of rest.  Follow up for any increased fever or other concerns.

## 2015-05-27 LAB — POCT INFLUENZA A/B: Influenza A, POC: NEGATIVE

## 2015-06-14 ENCOUNTER — Telehealth: Payer: Self-pay | Admitting: Family Medicine

## 2015-06-14 MED ORDER — LOSARTAN POTASSIUM 50 MG PO TABS: 50.0000 mg | ORAL_TABLET | Freq: Every day | ORAL | Status: DC

## 2015-06-14 NOTE — Telephone Encounter (Signed)
Losartan sent to the pharmacy by e-scribe.  Pt notified to pick up at the pharmacy.  Pt to keep an eye on her bp.  Notified her that cough should subside.  May take a few weeks to become fully effective.

## 2015-06-14 NOTE — Telephone Encounter (Signed)
Change to losartan 50 mg 1 po qd  Disp 30 refill x 3  Let us know how bp and cough doing after switch May take a few weeks

## 2015-06-14 NOTE — Telephone Encounter (Signed)
Pt called and left a message on my machine.  Stating her bp running in the 120s.  Only twice below 110.  Cough is intolerable since switching to 20 mg lisinopril.  Okay to change medication?

## 2015-06-22 ENCOUNTER — Encounter: Payer: Self-pay | Admitting: Nurse Practitioner

## 2015-06-22 ENCOUNTER — Ambulatory Visit (INDEPENDENT_AMBULATORY_CARE_PROVIDER_SITE_OTHER): Payer: BLUE CROSS/BLUE SHIELD | Admitting: Nurse Practitioner

## 2015-06-22 VITALS — BP 122/66 | HR 64 | Ht 60.5 in | Wt 158.0 lb

## 2015-06-22 DIAGNOSIS — Z01419 Encounter for gynecological examination (general) (routine) without abnormal findings: Secondary | ICD-10-CM | POA: Diagnosis not present

## 2015-06-22 DIAGNOSIS — Z0000 Encounter for general adult medical examination without abnormal findings: Secondary | ICD-10-CM

## 2015-06-22 DIAGNOSIS — Z1211 Encounter for screening for malignant neoplasm of colon: Secondary | ICD-10-CM

## 2015-06-22 MED ORDER — VALACYCLOVIR HCL 500 MG PO TABS: 500.0000 mg | ORAL_TABLET | Freq: Two times a day (BID) | ORAL | Status: DC

## 2015-06-22 NOTE — Progress Notes (Signed)
Patient ID: Laura Brooks, female   DOB: Mar 22, 1951, 65 y.o.   MRN: [id number]  65 y.o. G2P2002 Divorced  Caucasian Fe here for annual exam.  No new diagnosis this past year.  Partner is now having bad memory issues and they fear this is Alzheimer's.  Patient's last menstrual period was 04/10/2002.          Sexually active: No.  The current method of family planning is none.    Exercising: No.  The patient does not participate in regular exercise at present. Smoker:  no  Health Maintenance: Pap: 09/14/09, negative  MMG:07/14/14, Bi-Rads 1: Negative, has appointment on 07/15/15 Colonoscopy: 08/25/10, normal, repeat in 10 years BMD:06/08/14 T Score, -0.4 Radius / 0.5 Spine / -1.9 Left Femur Neck / -2.3 Right Femur Neck TDaP: 01/12/12 Shingles: 01/30/11 Pneumonia: Never Hep C :  Will get at PCP Labs: 01/2015 in [facility], Dr. Regis Bill   reports that she has quit smoking. She has never used smokeless tobacco. She reports that she drinks about 0.5 oz of alcohol per week. She reports that she does not use illicit drugs.  Past Medical History  Diagnosis Date  . Osteopenia     dexa 2009   . Radiculopathy, lumbar region     L4L5  . Benign microscopic hematuria     neg urol eval in past  . 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 HYPERLIPIDEMIA 04/08/2008    Qualifier: Diagnosis of  By: Regis Bill MD, Standley Brooking   . HEMATURIA UNSPECIFIED 05/17/2007    Qualifier: Diagnosis of  By: Hulan Saas, CMA (AAMA), Quita Skye   . STD (sexually transmitted disease)     HSV  . Osteoporosis     --hip  . Urinary incontinence     with coughing and sneezing  . Leukopenia 06/08/2010    Very mild   Has thyroid disease    Poss related to rx     No sx  Will repeat when convenient.    . Hypertension     Past Surgical History  Procedure Laterality Date  . Tonsillectomy    . Colonoscopy    . Total vaginal hysterectomy    . Bladder suspension  09/2010  . Wisdom tooth extraction      Current Outpatient  Prescriptions  Medication Sig Dispense Refill  . Ascorbic Acid (VITAMIN C) 1000 MG tablet Take 1,000 mg by mouth daily.      . Calcium Carbonate-Vitamin D (CALCIUM + D) 600-200 MG-UNIT TABS Take 1 capsule by mouth daily.      Marland Kitchen CRANBERRY EXTRACT PO Take 4,200 mg by mouth 2 (two) times daily.      . Cyanocobalamin (VITAMIN B 12 PO) Take 250 mg by mouth daily.      . fish oil-omega-3 fatty acids 1000 MG capsule Take 1 g by mouth daily.      Marland Kitchen levothyroxine (SYNTHROID, LEVOTHROID) 75 MCG tablet TAKE ONE TABLET BY MOUTH ONE TIME DAILY 90 tablet 2  . losartan (COZAAR) 50 MG tablet Take 1 tablet (50 mg total) by mouth daily. 30 tablet 3  . Magnesium 100 MG CAPS Take 1 capsule by mouth daily.      . Multiple Vitamins-Minerals (MULTIVITAMIN WITH MINERALS) tablet Take 1 tablet by mouth daily.      Marland Kitchen PREVIDENT 5000 SENSITIVE 1.1-5 % PSTE BRUSH TWICE DAILY  4  . valACYclovir (VALTREX) 500 MG tablet Take 1 tablet (500 mg total) by mouth 2 (two) times daily.  30 tablet 1   No current facility-administered medications for this visit.    Family History  Problem Relation Age of Onset  . Anemia Father   . Hypertension Father 51  . Diabetes Father   . Thyroid disease Sister   . Hypertension Mother 35  . Thyroid disease Mother   . Breast cancer Paternal Aunt   . Breast cancer Paternal Grandmother     ROS:  Pertinent items are noted in HPI.  Otherwise, a comprehensive ROS was negative.  Exam:   BP 122/66 mmHg  Pulse 64  Ht 5' 0.5" (1.537 m)  Wt 158 lb (71.668 kg)  BMI 30.34 kg/m2  LMP 04/10/2002 Height: 5' 0.5" (153.7 cm) Ht Readings from Last 3 Encounters:  06/22/15 5' 0.5" (1.537 m)  05/26/15 5' (1.524 m)  05/10/15 5' (1.524 m)    General appearance: alert, cooperative and appears stated age Head: Normocephalic, without obvious abnormality, atraumatic Neck: no adenopathy, supple, symmetrical, trachea midline and thyroid normal to inspection and palpation Lungs: clear to auscultation  bilaterally Breasts: normal appearance, no masses or tenderness Heart: regular rate and rhythm Abdomen: soft, non-tender; no masses,  no organomegaly Extremities: extremities normal, atraumatic, no cyanosis or edema Skin: Skin color, texture, turgor normal. No rashes or lesions Lymph nodes: Cervical, supraclavicular, and axillary nodes normal. No abnormal inguinal nodes palpated Neurologic: Grossly normal   Pelvic: External genitalia:  no lesions              Urethra:  normal appearing urethra with no masses, tenderness or lesions              Bartholin's and Skene's: normal                 Vagina: normal appearing vagina with normal color and discharge, no lesions              Cervix: absent              Pap taken: No. Bimanual Exam:  Uterus:  uterus absent              Adnexa: no mass, fullness, tenderness               Rectovaginal: Confirms               Anus:  normal sphincter tone, no lesions  Chaperone present: no  A:  Well Woman with normal exam  Status post TVH and prolapse repair 09/2010 Atrophic vagina.  Osteopenia  History of HSV   P:   Reviewed health and wellness pertinent to exam  Pap smear as above  Mammogram is due and scheduled for 07/15/15  Refill on Valtrex but has no outbreak  Counseled on breast self exam, mammography screening, adequate intake of calcium and vitamin D, diet and exercise return annually or prn  An After Visit Summary was printed and given to the patient.

## 2015-06-22 NOTE — Progress Notes (Signed)
Encounter reviewed by Dr. Tarris Delbene Amundson C. Silva.  

## 2015-06-22 NOTE — Patient Instructions (Addendum)

## 2015-06-23 ENCOUNTER — Other Ambulatory Visit (INDEPENDENT_AMBULATORY_CARE_PROVIDER_SITE_OTHER): Payer: BLUE CROSS/BLUE SHIELD

## 2015-06-23 ENCOUNTER — Telehealth: Payer: Self-pay | Admitting: Nurse Practitioner

## 2015-06-23 DIAGNOSIS — I10 Essential (primary) hypertension: Secondary | ICD-10-CM | POA: Diagnosis not present

## 2015-06-23 DIAGNOSIS — Z1211 Encounter for screening for malignant neoplasm of colon: Secondary | ICD-10-CM

## 2015-06-23 LAB — BASIC METABOLIC PANEL
BUN: 18 mg/dL (ref 6–23)
CHLORIDE: 104 meq/L (ref 96–112)
CO2: 29 mEq/L (ref 19–32)
CREATININE: 0.82 mg/dL (ref 0.40–1.20)
Calcium: 9.1 mg/dL (ref 8.4–10.5)
GFR: 74.23 mL/min (ref >60.00)
Glucose, Bld: 96 mg/dL (ref 70–99)
POTASSIUM: 4.2 meq/L (ref 3.5–5.1)
SODIUM: 141 meq/L (ref 135–145)

## 2015-06-23 LAB — VITAMIN D 25 HYDROXY (VIT D DEFICIENCY, FRACTURES): VITD: 26.82 ng/mL — ABNORMAL LOW (ref 30.00–100.00)

## 2015-06-23 NOTE — Telephone Encounter (Signed)
Spoke with patient. Patient states that she just had her labs drawn at Dr.Panosh's office and they were able to see the Vitamin D order in the computer and will run this with her lab work. Reports she forgot to get her IFOB kit before she left yesterday, but is going to get this while she is at her appointment today with Dr.Panosh as they have these in the office as well. Will return call if she needs anything.  Routing to provider for final review. Patient agreeable to disposition. Will close encounter.

## 2015-06-23 NOTE — Telephone Encounter (Signed)
I have placed orders for both Vit D and Hep C - did both get done?  I also went back and took out order for IFOB since she is getting at PCP office.

## 2015-06-23 NOTE — Addendum Note (Signed)
Addended by: Antonietta Barcelona on: 06/23/2015 08:24 AM   Modules accepted: Orders

## 2015-06-23 NOTE — Telephone Encounter (Signed)
1. Patient seen yesterday by Kem Boroughs, FNP for her AEX and she is seeing her PCP at this morning and is on her way there now. She said she wanted to be sure Patty put in an order for for vitamin D testing to get done there with her other blood work today.  2. Patient needs an IFOB kit. Please call when ready for pick up.

## 2015-06-23 NOTE — Addendum Note (Signed)
Addended by: Antonietta Barcelona on: 06/23/2015 08:52 AM   Modules accepted: Orders

## 2015-06-23 NOTE — Telephone Encounter (Signed)
Spoke with patient. Patient states that she did have her Vitamin D and Hep C ran today with lab work from [facility] office.  Routing to provider for final review. Encounter previously closed.

## 2015-06-23 NOTE — Telephone Encounter (Signed)
Routing to Kem Boroughs, FNP and Colletta [place] for follow as instructed.

## 2015-06-24 LAB — HEPATITIS C ANTIBODY: HCV AB: NEGATIVE

## 2015-06-27 NOTE — Progress Notes (Signed)
Pre visit review using our clinic review tool, if applicable. No additional management support is needed unless otherwise documented below in the visit note.  Chief Complaint  Patient presents with  . Follow-up  . Hypertension    HPI: Laura Brooks 65 y.o.  comesin for fu  Ht bp management has monitor   Readings at home have been 130 120s over 80 [id number] daistolics   Cough is a lot better . No other se of med Had labs done at [facility] taking 4000 iu d3 per day ROS: See pertinent positives and negatives per HPI.  Past Medical History  Diagnosis Date  . Osteopenia     dexa 2009   . Radiculopathy, lumbar region     L4L5  . Benign microscopic hematuria     neg urol eval in past  . 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 HYPERLIPIDEMIA 04/08/2008    Qualifier: Diagnosis of  By: Regis Bill MD, Standley Brooking   . HEMATURIA UNSPECIFIED 05/17/2007    Qualifier: Diagnosis of  By: Hulan Saas, CMA (AAMA), Quita Skye   . STD (sexually transmitted disease)     HSV  . Osteoporosis     --hip  . Urinary incontinence     with coughing and sneezing  . Leukopenia 06/08/2010    Very mild   Has thyroid disease    Poss related to rx     No sx  Will repeat when convenient.    . Hypertension     Family History  Problem Relation Age of Onset  . Anemia Father   . Hypertension Father 68  . Diabetes Father   . Thyroid disease Sister   . Hypertension Mother 1  . Thyroid disease Mother   . Breast cancer Paternal Aunt   . Breast cancer Paternal Grandmother     Social History   Social History  . Marital Status: Divorced    Spouse Name: N/A  . Number of Children: N/A  . Years of Education: N/A   Occupational History  . [employer]   . ASSIST OF [employer]     Social History Main Topics  . Smoking status: Former Research scientist (life sciences)  . Smokeless tobacco: Never Used  . Alcohol Use: 0.5 oz/week    1 Standard drinks or equivalent per week  . Drug Use: No  . Sexual Activity:    Partners: Male   Birth Control/ Protection: Surgical     Comment: TVH 2012   Other Topics Concern  . None   Social History Narrative   Divorced former Smoker   Works Administrator, [employer]  Pet fish and parrot.    Outpatient Prescriptions Prior to Visit  Medication Sig Dispense Refill  . Ascorbic Acid (VITAMIN C) 1000 MG tablet Take 1,000 mg by mouth daily.      . Calcium Carbonate-Vitamin D (CALCIUM + D) 600-200 MG-UNIT TABS Take 1 capsule by mouth daily.      Marland Kitchen CRANBERRY EXTRACT PO Take 4,200 mg by mouth 2 (two) times daily.      . Cyanocobalamin (VITAMIN B 12 PO) Take 250 mg by mouth daily.      . fish oil-omega-3 fatty acids 1000 MG capsule Take 1 g by mouth daily.      Marland Kitchen levothyroxine (SYNTHROID, LEVOTHROID) 75 MCG tablet TAKE ONE TABLET BY MOUTH ONE TIME DAILY 90 tablet 2  . Magnesium 100 MG CAPS Take 1 capsule by mouth daily.      Marland Kitchen  Multiple Vitamins-Minerals (MULTIVITAMIN WITH MINERALS) tablet Take 1 tablet by mouth daily.      Marland Kitchen PREVIDENT 5000 SENSITIVE 1.1-5 % PSTE BRUSH TWICE DAILY  4  . valACYclovir (VALTREX) 500 MG tablet Take 1 tablet (500 mg total) by mouth 2 (two) times daily. 30 tablet 1  . losartan (COZAAR) 50 MG tablet Take 1 tablet (50 mg total) by mouth daily. 30 tablet 3   No facility-administered medications prior to visit.     EXAM:  BP 125/76 mmHg  Temp(Src) 98.7 F (37.1 C) (Oral)  Ht 5' 0.5" (1.537 m)  Wt   LMP 04/10/2002  There is no weight on file to calculate BMI.  GENERAL: vitals reviewed and listed above, alert, oriented, appears well hydrated and in no acute distress HEENT: atraumatic, conjunctiva  clear, no obvious abnormalities on inspection of external nose and ears PSYCH: pleasant and cooperative, no obvious depression or anxiety BP Readings from Last 3 Encounters:  06/28/15 125/76  06/22/15 122/66  05/26/15 140/90   bp readings  128/76 her monitor  Office repeat 130/78 ASSESSMENT AND PLAN:  Discussed the following assessment and  plan:  Essential hypertension - home monitor correlates  continue med acceptable control  Medication management - acei cough just about gone  Low serum vitamin D - 26 range   YEARLY visit oct nov 17-Patient advised to return or notify health care team  if symptoms worsen ,persist or new concerns arise.  Patient Instructions  Vit d slightly low    Can increase  To maximum 5000 iu per day average.  BP  is good .  Stay on same  Medication. Check bp periodically   Per month.  Wellness check up  In the fall    Flora Parks K. Avamarie Crossley M.D.

## 2015-06-28 ENCOUNTER — Ambulatory Visit (INDEPENDENT_AMBULATORY_CARE_PROVIDER_SITE_OTHER): Payer: BLUE CROSS/BLUE SHIELD | Admitting: Internal Medicine

## 2015-06-28 ENCOUNTER — Encounter: Payer: Self-pay | Admitting: Internal Medicine

## 2015-06-28 VITALS — BP 125/76 | Temp 98.7°F | Ht 60.5 in

## 2015-06-28 DIAGNOSIS — I10 Essential (primary) hypertension: Secondary | ICD-10-CM | POA: Diagnosis not present

## 2015-06-28 DIAGNOSIS — Z79899 Other long term (current) drug therapy: Secondary | ICD-10-CM | POA: Diagnosis not present

## 2015-06-28 DIAGNOSIS — R7989 Other specified abnormal findings of blood chemistry: Secondary | ICD-10-CM | POA: Diagnosis not present

## 2015-06-28 MED ORDER — LOSARTAN POTASSIUM 50 MG PO TABS: 50.0000 mg | ORAL_TABLET | Freq: Every day | ORAL | Status: DC

## 2015-06-28 NOTE — Patient Instructions (Signed)
Vit d slightly low    Can increase  To maximum 5000 iu per day average.  BP  is good .  Stay on same  Medication. Check bp periodically   Per month.  Wellness check up  In the fall

## 2015-07-01 ENCOUNTER — Other Ambulatory Visit (INDEPENDENT_AMBULATORY_CARE_PROVIDER_SITE_OTHER): Payer: BLUE CROSS/BLUE SHIELD

## 2015-07-01 DIAGNOSIS — Z1211 Encounter for screening for malignant neoplasm of colon: Secondary | ICD-10-CM | POA: Diagnosis not present

## 2015-07-01 LAB — FECAL OCCULT BLOOD, IMMUNOCHEMICAL: Fecal Occult Bld: NEGATIVE

## 2015-07-01 NOTE — Progress Notes (Signed)
Quick Note:  Inform patient stool test negative for blood . Neg screen ______

## 2015-09-09 ENCOUNTER — Encounter: Payer: Self-pay | Admitting: Nurse Practitioner

## 2015-12-10 ENCOUNTER — Telehealth: Payer: Self-pay | Admitting: Family Medicine

## 2015-12-10 ENCOUNTER — Other Ambulatory Visit: Payer: Self-pay | Admitting: Internal Medicine

## 2015-12-10 NOTE — Telephone Encounter (Signed)
Sent to the pharmacy by e-scribe.  Pt due for medicare wellness in Oct.  Message sent to scheduling.

## 2015-12-10 NOTE — Telephone Encounter (Signed)
PT HAS BEEN SCH

## 2015-12-10 NOTE — Telephone Encounter (Signed)
Pt is due cor medicare wellness in Oct 2017.  Please help her to make that fasting appointment or schedule her for lab work.  Let me know if I need to enter lab orders.  Thanks!!!

## 2016-02-02 ENCOUNTER — Other Ambulatory Visit: Payer: BLUE CROSS/BLUE SHIELD

## 2016-02-09 ENCOUNTER — Encounter: Payer: BLUE CROSS/BLUE SHIELD | Admitting: Internal Medicine

## 2016-02-22 ENCOUNTER — Telehealth: Payer: Self-pay | Admitting: Internal Medicine

## 2016-02-22 NOTE — Telephone Encounter (Signed)
Not covered  By insurance    Not a  Indicated test for routine screening    But Can discuss at [facility]

## 2016-02-22 NOTE — Telephone Encounter (Signed)
° ° °  Pt call to ask if the following test can be addred to her lab that she is having done on Thursday 02/24/16  She said the test is called MTHFR  for a gene mutation. She said she just found out her daughter has it

## 2016-02-23 NOTE — Telephone Encounter (Signed)
Spoke to the pt.  Explained that test is not covered by insurance and is not indicate for routine testing.  Pt agreed to discuss at upcoming ov.  Is not interested in the test if it is not covered by insurance.

## 2016-02-24 ENCOUNTER — Other Ambulatory Visit (INDEPENDENT_AMBULATORY_CARE_PROVIDER_SITE_OTHER): Payer: 59

## 2016-02-24 DIAGNOSIS — Z0000 Encounter for general adult medical examination without abnormal findings: Secondary | ICD-10-CM

## 2016-02-24 DIAGNOSIS — R7989 Other specified abnormal findings of blood chemistry: Secondary | ICD-10-CM

## 2016-02-24 LAB — BASIC METABOLIC PANEL
BUN: 16 mg/dL (ref 6–23)
CO2: 28 mEq/L (ref 19–32)
Calcium: 9.4 mg/dL (ref 8.4–10.5)
Chloride: 106 mEq/L (ref 96–112)
Creatinine, Ser: 0.9 mg/dL (ref 0.40–1.20)
GFR: 66.53 mL/min (ref >60.00)
GLUCOSE: 100 mg/dL — AB (ref 70–99)
POTASSIUM: 4.3 meq/L (ref 3.5–5.1)
SODIUM: 141 meq/L (ref 135–145)

## 2016-02-24 LAB — LDL CHOLESTEROL, DIRECT: LDL DIRECT: 131 mg/dL

## 2016-02-24 LAB — HEPATIC FUNCTION PANEL
ALT: 20 U/L (ref 0–35)
AST: 17 U/L (ref 0–37)
Albumin: 4 g/dL (ref 3.5–5.2)
Alkaline Phosphatase: 49 U/L (ref 39–117)
Bilirubin, Direct: 0.1 mg/dL (ref 0.0–0.3)
Total Bilirubin: 0.4 mg/dL (ref 0.2–1.2)
Total Protein: 6.3 g/dL (ref 6.0–8.3)

## 2016-02-24 LAB — CBC WITH DIFFERENTIAL/PLATELET
BASOS ABS: 0 10*3/uL (ref 0.0–0.1)
Basophils Relative: 0.7 % (ref 0.0–3.0)
EOS ABS: 0.1 10*3/uL (ref 0.0–0.7)
Eosinophils Relative: 2.1 % (ref 0.0–5.0)
HCT: 33.1 % — ABNORMAL LOW (ref 36.0–46.0)
HEMOGLOBIN: 11.4 g/dL — AB (ref 12.0–15.0)
LYMPHS ABS: 1.4 10*3/uL (ref 0.7–4.0)
Lymphocytes Relative: 32.9 % (ref 12.0–46.0)
MCHC: 34.4 g/dL (ref 30.0–36.0)
MCV: 90.8 fl (ref 78.0–100.0)
MONO ABS: 0.4 10*3/uL (ref 0.1–1.0)
Monocytes Relative: 9.1 % (ref 3.0–12.0)
NEUTROS PCT: 55.2 % (ref 43.0–77.0)
Neutro Abs: 2.4 10*3/uL (ref 1.4–7.7)
Platelets: 277 10*3/uL (ref 150.0–400.0)
RBC: 3.65 Mil/uL — AB (ref 3.87–5.11)
RDW: 13.3 % (ref 11.5–15.5)
WBC: 4.3 10*3/uL (ref 4.0–10.5)

## 2016-02-24 LAB — TSH: TSH: 2.13 u[IU]/mL (ref 0.35–4.50)

## 2016-02-24 LAB — LIPID PANEL
CHOL/HDL RATIO: 5
Cholesterol: 219 mg/dL — ABNORMAL HIGH (ref 0–200)
HDL: 43 mg/dL (ref >39.00)
NONHDL: 175.75
Triglycerides: 252 mg/dL — ABNORMAL HIGH (ref 0.0–149.0)
VLDL: 50.4 mg/dL — AB (ref 0.0–40.0)

## 2016-03-06 ENCOUNTER — Other Ambulatory Visit: Payer: Self-pay | Admitting: Internal Medicine

## 2016-03-08 ENCOUNTER — Ambulatory Visit (INDEPENDENT_AMBULATORY_CARE_PROVIDER_SITE_OTHER): Payer: 59 | Admitting: Internal Medicine

## 2016-03-08 ENCOUNTER — Encounter: Payer: Self-pay | Admitting: Internal Medicine

## 2016-03-08 VITALS — BP 160/82 | Temp 98.1°F | Ht 60.5 in | Wt 164.2 lb

## 2016-03-08 DIAGNOSIS — E785 Hyperlipidemia, unspecified: Secondary | ICD-10-CM

## 2016-03-08 DIAGNOSIS — Z23 Encounter for immunization: Secondary | ICD-10-CM

## 2016-03-08 DIAGNOSIS — D649 Anemia, unspecified: Secondary | ICD-10-CM | POA: Diagnosis not present

## 2016-03-08 DIAGNOSIS — E890 Postprocedural hypothyroidism: Secondary | ICD-10-CM | POA: Diagnosis not present

## 2016-03-08 DIAGNOSIS — Z0000 Encounter for general adult medical examination without abnormal findings: Secondary | ICD-10-CM

## 2016-03-08 DIAGNOSIS — Z79899 Other long term (current) drug therapy: Secondary | ICD-10-CM

## 2016-03-08 DIAGNOSIS — I10 Essential (primary) hypertension: Secondary | ICD-10-CM

## 2016-03-08 MED ORDER — LEVOTHYROXINE SODIUM 75 MCG PO TABS: 75.0000 ug | ORAL_TABLET | Freq: Every day | ORAL | 3 refills | Status: DC

## 2016-03-08 NOTE — Progress Notes (Signed)
Chief Complaint  Patient presents with  . Annual Exam    HPI: Patient  Laura Brooks  65 y.o. w hypothyroid elevated lipids  ht  comes in today for Preventive Health Care visit   HT  Good levels at  Blood donation  120/80 Thyroid  Taking meds  Mom deceased  Acute complications for rti  Picks disease  Friend  Not exercising as much  utd  On hcm  Health Maintenance  Topic Date Due  . INFLUENZA VACCINE  09/07/2016 (Originally 11/09/2015)  . MAMMOGRAM  08/22/2016  . COLONOSCOPY  08/24/2020  . TETANUS/TDAP  01/11/2022  . DEXA SCAN  Completed  . ZOSTAVAX  Completed  . Hepatitis C Screening  Completed  . PNA vac Low Risk Adult  Completed   Health Maintenance Review LIFESTYLE:  Exercise:  Walking not as much  Tobacco/ETS: Alcohol: 2 per week or so  Sugar beverages: Sleep: 5 hrs Drug use: no  Work: 40 - 80 hours per week     ROS:  GEN/ HEENT: No fever, significant weight changes sweats headaches vision problems hearing changes, CV/ PULM; No chest pain shortness of breath cough, syncope,edema  change in exercise tolerance. GI /GU: No adominal pain, vomiting, change in bowel habits. No blood in the stool. No significant GU symptoms. SKIN/HEME: ,no acute skin rashes suspicious lesions or bleeding. No lymphadenopathy, nodules, masses.  NEURO/ PSYCH:  No neurologic signs such as weakness numbness. No depression anxiety. IMM/ Allergy: No unusual infections.  Allergy .   REST of 12 system review negative except as per HPI   Past Medical History:  Diagnosis Date  . Benign microscopic hematuria    neg urol eval in past  . HEMATURIA UNSPECIFIED 05/17/2007   Qualifier: Diagnosis of  By: Hulan Saas, CMA (AAMA), Quita Skye   . HYPERLIPIDEMIA 04/08/2008   Qualifier: Diagnosis of  By: Regis Bill MD, Standley Brooking   . Hypertension   . Leukopenia 06/08/2010   Very mild   Has thyroid disease    Poss related to rx     No sx  Will repeat when convenient.    . Osteopenia    dexa 2009   . Osteoporosis     --hip  . Radiculopathy, lumbar region    L4L5  . STD (sexually transmitted disease)    HSV  . 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 Urinary incontinence    with coughing and sneezing    Past Surgical History:  Procedure Laterality Date  . BLADDER SUSPENSION  09/2010  . COLONOSCOPY    . TONSILLECTOMY    . TOTAL VAGINAL HYSTERECTOMY    . WISDOM TOOTH EXTRACTION      Family History  Problem Relation Age of Onset  . Anemia Father   . Hypertension Father 47  . Diabetes Father   . Thyroid disease Sister   . Hypertension Mother 17  . Thyroid disease Mother   . Breast cancer Paternal Aunt   . Breast cancer Paternal Grandmother     Social History   Social History  . Marital status: Divorced    Spouse name: N/A  . Number of children: N/A  . Years of education: N/A   Occupational History  . [employer]  . ASSIST OF DIRECT FUND  [employer]   Social History Main Topics  . Smoking status: Former Research scientist (life sciences)  . Smokeless tobacco: Never Used  . Alcohol use 0.5 oz/week  1 Standard drinks or equivalent per week  . Drug use: No  . Sexual activity: Yes    Partners: Male    Birth control/ protection: Surgical     Comment: TVH 2012   Other Topics Concern  . None   Social History Narrative   Divorced former Smoker   Works Administrator, [employer]  Pet fish and parrot.    Outpatient Medications Prior to Visit  Medication Sig Dispense Refill  . Ascorbic Acid (VITAMIN C) 1000 MG tablet Take 1,000 mg by mouth daily.      . Calcium Carbonate-Vitamin D (CALCIUM + D) 600-200 MG-UNIT TABS Take 1 capsule by mouth daily.      Marland Kitchen CRANBERRY EXTRACT PO Take 4,200 mg by mouth 2 (two) times daily.      . fish oil-omega-3 fatty acids 1000 MG capsule Take 1 g by mouth daily.      Marland Kitchen losartan (COZAAR) 50 MG tablet Take 1 tablet (50 mg total) by mouth daily. 90 tablet 3  . Magnesium 100 MG CAPS Take 1 capsule by mouth  daily.      . Multiple Vitamins-Minerals (MULTIVITAMIN WITH MINERALS) tablet Take 1 tablet by mouth daily.      Marland Kitchen PREVIDENT 5000 SENSITIVE 1.1-5 % PSTE BRUSH TWICE DAILY  4  . valACYclovir (VALTREX) 500 MG tablet Take 1 tablet (500 mg total) by mouth 2 (two) times daily. 30 tablet 1  . levothyroxine (SYNTHROID, LEVOTHROID) 75 MCG tablet TAKE ONE TABLET BY MOUTH ONE TIME DAILY 90 tablet 0  . Cyanocobalamin (VITAMIN B 12 PO) Take 250 mg by mouth daily.       No facility-administered medications prior to visit.      EXAM:  BP (!) 160/82 (BP Location: Right Arm, Cuff Size: Normal)   Temp 98.1 F (36.7 C) (Oral)   Ht 5' 0.5" (1.537 m)   Wt 164 lb 3.2 oz (74.5 kg)   LMP 04/10/2002   BMI 31.54 kg/m   Body mass index is 31.54 kg/m.  Physical Exam: Vital signs reviewed [id number] is a well-developed well-nourished alert cooperative    who appearsr stated age in no acute distress.  HEENT: normocephalic atraumatic , Eyes: PERRL EOM's full, conjunctiva clear, Nares: paten,t no deformity discharge or tenderness., Ears: no deformity EAC's clear TMs with normal landmarks. Mouth: clear OP, no lesions, edema.  Moist mucous membranes. Dentition in adequate repair. NECK: supple without masses, thyromegaly or bruits. CHEST/PULM:  Clear to auscultation and percussion breath sounds equal no wheeze , rales or rhonchi. No chest wall deformities or tenderness.Breast: normal by inspection . No dimpling, discharge, masses, tenderness or discharge . CV: PMI is nondisplaced, S1 S2 no gallops, murmurs, rubs. Peripheral pulses are full without delay.No JVD .  ABDOMEN: Bowel sounds normal nontender  No guard or rebound, no hepato splenomegal no CVA tenderness.  No hernia. Extremtities:  No clubbing cyanosis or edema, no acute joint swelling or redness no focal atrophy NEURO:  Oriented x3, cranial nerves 3-12 appear to be intact, no obvious focal weakness,gait within normal limits no abnormal reflexes or  asymmetrical SKIN: No acute rashes normal turgor, color, no bruising or petechiae. PSYCH: Oriented, good eye contact, no obvious depression anxiety, cognition and judgment appear normal. LN: no cervical axillary inguinal adenopathy  Lab Results  Component Value Date   WBC 4.3 02/24/2016   HGB 11.4 (L) 02/24/2016   HCT 33.1 (L) 02/24/2016   PLT 277.0 02/24/2016   GLUCOSE 100 (H)  02/24/2016   CHOL 219 (H) 02/24/2016   TRIG 252.0 (H) 02/24/2016   HDL 43.00 02/24/2016   LDLDIRECT 131.0 02/24/2016   LDLCALC 118 (H) 10/31/2013   ALT 20 02/24/2016   AST 17 02/24/2016   NA 141 02/24/2016   K 4.3 02/24/2016   CL 106 02/24/2016   CREATININE 0.90 02/24/2016   BUN 16 02/24/2016   CO2 28 02/24/2016   TSH 2.13 02/24/2016   INR 1.08 09/20/2010  reviewed labs   ASSESSMENT AND PLAN:  Discussed the following assessment and plan:  Visit for preventive health examination  Essential hypertension - repeat bp and at donation was at goal reveiwed self monitor and advise intesification intervation if not at goal   Medication management  Mild anemia - [place]  prob from recnet blood donation pre obtaining blood sample  not high risk will follow  Hypothyroidism, postablative - contniue same med  Need for 23-polyvalent pneumococcal polysaccharide vaccine - Plan: Pneumococcal polysaccharide vaccine 23-valent greater than or equal to 2yo subcutaneous/IM  Hyperlipidemia, unspecified hyperlipidemia type - back to lsi cv event risk per guidlines  see textsconsider statin med  Donate blood  Repeat with iron studies    Triglycerides are  higher and  You lipid profic is unfavorable  You can consider addimg statin  9.7 % medication   Patient Care Team: Burnis Medin, MD as PCP - General Clarisa Fling, MD (Inactive) as Referring Physician (Internal Medicine) Kem Boroughs, FNP as Nurse Practitioner (Family Medicine) Patient Instructions   Pay attention to lifestyle intervention healthy eating  and exercise .  Make sure BP is at goal .   Below 140/90 and preferred  Closer to 120/80 triglyceridse are higher  . Lipid profile is unfavorable   10 year risk of cv events   About 9.7%  Meets guidelines of benefot more than risk  Can continue to  Attend to  Life style  Add statin at any time   Healthy weight loss  Will help a great deal .    Wt Readings from Last 3 Encounters:  03/08/16 164 lb 3.2 oz (74.5 kg)  06/22/15 158 lb (71.7 kg)  05/26/15 160 lb (72.6 kg)   Anemia is prob from blood donation  Get labs in 3-4 months cbc diff and iron studies   If ok then yearly  cpx labs     [place] K. Lizet Kelso M.D.

## 2016-03-08 NOTE — Progress Notes (Signed)
  Pre visit review using our clinic review tool, if applicable. No additional management support is needed unless otherwise documented below in the visit note.  Chief Complaint  Patient presents with  . Annual Exam

## 2016-03-08 NOTE — Telephone Encounter (Signed)
Sent to the pharmacy by e-scribe for 1 year.  Pt has upcoming cpx on 03/09/16.  Seen today for yearly.  TSH normal.

## 2016-03-08 NOTE — Patient Instructions (Addendum)
Pay attention to lifestyle intervention healthy eating and exercise .  Make sure BP is at goal .   Below 140/90 and preferred  Closer to 120/80 triglyceridse are higher  . Lipid profile is unfavorable   10 year risk of cv events   About 9.7%  Meets guidelines of benefot more than risk  Can continue to  Attend to  Life style  Add statin at any time   Healthy weight loss  Will help a great deal .    Wt Readings from Last 3 Encounters:  03/08/16 164 lb 3.2 oz (74.5 kg)  06/22/15 158 lb (71.7 kg)  05/26/15 160 lb (72.6 kg)   Anemia is prob from blood donation  Get labs in 3-4 months cbc diff and iron studies   If ok then yearly  cpx labs

## 2016-06-26 ENCOUNTER — Other Ambulatory Visit: Payer: Self-pay | Admitting: Internal Medicine

## 2016-06-26 ENCOUNTER — Other Ambulatory Visit: Payer: Self-pay | Admitting: Emergency Medicine

## 2016-06-26 DIAGNOSIS — D649 Anemia, unspecified: Secondary | ICD-10-CM

## 2016-06-28 ENCOUNTER — Other Ambulatory Visit: Payer: 59

## 2016-06-30 ENCOUNTER — Ambulatory Visit: Payer: BLUE CROSS/BLUE SHIELD | Admitting: Nurse Practitioner

## 2016-07-05 ENCOUNTER — Other Ambulatory Visit (INDEPENDENT_AMBULATORY_CARE_PROVIDER_SITE_OTHER): Payer: 59

## 2016-07-05 DIAGNOSIS — D649 Anemia, unspecified: Secondary | ICD-10-CM

## 2016-07-05 LAB — IRON,TIBC AND FERRITIN PANEL
%SAT: 30 % (ref 11–50)
FERRITIN: 59 ng/mL (ref 20–288)
IRON: 76 ug/dL (ref 45–160)
TIBC: 253 ug/dL (ref 250–450)

## 2016-07-05 LAB — CBC WITH DIFFERENTIAL/PLATELET
BASOS ABS: 0 10*3/uL (ref 0.0–0.1)
Basophils Relative: 0.8 % (ref 0.0–3.0)
EOS ABS: 0.1 10*3/uL (ref 0.0–0.7)
Eosinophils Relative: 2 % (ref 0.0–5.0)
HCT: 36.5 % (ref 36.0–46.0)
HEMOGLOBIN: 12.5 g/dL (ref 12.0–15.0)
LYMPHS ABS: 1.7 10*3/uL (ref 0.7–4.0)
Lymphocytes Relative: 34.3 % (ref 12.0–46.0)
MCHC: 34.1 g/dL (ref 30.0–36.0)
MCV: 91.4 fl (ref 78.0–100.0)
Monocytes Absolute: 0.4 10*3/uL (ref 0.1–1.0)
Monocytes Relative: 8.4 % (ref 3.0–12.0)
NEUTROS PCT: 54.5 % (ref 43.0–77.0)
Neutro Abs: 2.6 10*3/uL (ref 1.4–7.7)
Platelets: 270 10*3/uL (ref 150.0–400.0)
RBC: 4 Mil/uL (ref 3.87–5.11)
RDW: 13.3 % (ref 11.5–15.5)
WBC: 4.8 10*3/uL (ref 4.0–10.5)

## 2016-07-17 LAB — HM DEXA SCAN

## 2016-07-25 ENCOUNTER — Encounter: Payer: Self-pay | Admitting: Family Medicine

## 2016-07-26 ENCOUNTER — Encounter: Payer: Self-pay | Admitting: Nurse Practitioner

## 2016-07-26 ENCOUNTER — Ambulatory Visit (INDEPENDENT_AMBULATORY_CARE_PROVIDER_SITE_OTHER): Payer: 59 | Admitting: Nurse Practitioner

## 2016-07-26 VITALS — BP 136/78 | HR 72 | Ht 60.5 in | Wt 158.0 lb

## 2016-07-26 DIAGNOSIS — Z1211 Encounter for screening for malignant neoplasm of colon: Secondary | ICD-10-CM

## 2016-07-26 DIAGNOSIS — Z01411 Encounter for gynecological examination (general) (routine) with abnormal findings: Secondary | ICD-10-CM

## 2016-07-26 DIAGNOSIS — Z0000 Encounter for general adult medical examination without abnormal findings: Secondary | ICD-10-CM

## 2016-07-26 MED ORDER — VALACYCLOVIR HCL 500 MG PO TABS: 500.0000 mg | ORAL_TABLET | Freq: Two times a day (BID) | ORAL | 1 refills | Status: AC

## 2016-07-26 NOTE — Progress Notes (Signed)
66 y.o. [id number] Divorced  Caucasian Fe here for annual exam.  Seen by PCP for anemia following blood donation.  Repeat iron studies were normal 07/05/16.   Last visit concerned about Alzheimer's with her significant other.  He is now in a Nursing Home. Still working full time at the [employer] and will plan to retire ~  age 55.  Father will be 14 this year. They are all going home as a family this weekend to [place] to put the grave stone on mother's grave who died a year ago.    Discussion about BMD report was reviewed and discussed.  She had loss at the spine at -8% and improvement at the right hip with minimal loss at the left hip since 05/2014.  FRAX score was 11%/ 1.7%.  Patient's last menstrual period was 04/10/2002.          Sexually active: Yes.    The current method of family planning is status post hysterectomy.    Exercising: No.  The patient does not participate in regular exercise at present. Smoker:  no  Health Maintenance: Pap: 09/14/09, Negative (TAH) History of Abnormal Pap: no MMG: 08/23/15, 2D, Bi-Rads 1:Negative Self Breast exams: yes Colonoscopy:  08/25/10, normal, repeat in 10 years BMD:07/17/16, T Score: -0.70 Spine / -2.20 Right Femur Neck / -2.10 Left Femur Neck TDaP: 01/12/12 Shingles: 01/30/11 Pneumonia: 03/08/16 Pneumovax, 02/02/15 Prevnar-13 Hep C : 06/23/15 Labs: PCP takes care of all labs   reports that she has quit smoking. She has never used smokeless tobacco. She reports that she drinks about 0.5 oz of alcohol per week . She reports that she does not use drugs.  Past Medical History:  Diagnosis Date  . Benign microscopic hematuria    neg urol eval in past  . HEMATURIA UNSPECIFIED 05/17/2007   Qualifier: Diagnosis of  By: Hulan Saas, CMA (AAMA), Quita Skye   . HYPERLIPIDEMIA 04/08/2008   Qualifier: Diagnosis of  By: Regis Bill MD, Standley Brooking   . Hypertension   . Leukopenia 06/08/2010   Very mild   Has thyroid disease    Poss related to rx     No sx  Will repeat when  convenient.    . Osteopenia    dexa 2009   . Osteoporosis    --hip  . Radiculopathy, lumbar region    L4L5  . STD (sexually transmitted disease)    HSV  . 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 Urinary incontinence    with coughing and sneezing    Past Surgical History:  Procedure Laterality Date  . BLADDER SUSPENSION  09/2010  . COLONOSCOPY    . TONSILLECTOMY    . TOTAL VAGINAL HYSTERECTOMY    . WISDOM TOOTH EXTRACTION      Current Outpatient Prescriptions  Medication Sig Dispense Refill  . Ascorbic Acid (VITAMIN C) 1000 MG tablet Take 1,000 mg by mouth daily.      . Calcium Carbonate-Vitamin D (CALCIUM + D) 600-200 MG-UNIT TABS Take 1 capsule by mouth daily.      Marland Kitchen CRANBERRY EXTRACT PO Take 4,200 mg by mouth 2 (two) times daily.      . fish oil-omega-3 fatty acids 1000 MG capsule Take 1 g by mouth daily.      Marland Kitchen levothyroxine (SYNTHROID, LEVOTHROID) 75 MCG tablet Take 75 mcg by mouth daily.  3  . losartan (COZAAR) 50 MG tablet TAKE 1 TABLET (50 MG TOTAL) BY MOUTH DAILY.**FILL3/29/2017** 90 tablet 3  .  Magnesium 100 MG CAPS Take 1 capsule by mouth daily.      . Multiple Vitamins-Minerals (MULTIVITAMIN WITH MINERALS) tablet Take 1 tablet by mouth daily.      Marland Kitchen PREVIDENT 5000 SENSITIVE 1.1-5 % PSTE BRUSH TWICE DAILY  4  . valACYclovir (VALTREX) 500 MG tablet Take 1 tablet (500 mg total) by mouth 2 (two) times daily. 30 tablet 1   No current facility-administered medications for this visit.     Family History  Problem Relation Age of Onset  . Anemia Father   . Hypertension Father 49  . Diabetes Father   . Thyroid disease Sister   . Hypertension Mother 30  . Thyroid disease Mother   . Breast cancer Paternal Aunt   . Breast cancer Paternal Grandmother     ROS:  Pertinent items are noted in HPI.  Otherwise, a comprehensive ROS was negative.  Exam:   BP 136/78 (BP Location: Right Arm, Patient Position: Sitting, Cuff Size: Normal)   Pulse 72   Ht 5' 0.5"  (1.537 m)   Wt 158 lb (71.7 kg)   LMP 04/10/2002   BMI 30.35 kg/m  Height: 5' 0.5" (153.7 cm) Ht Readings from Last 3 Encounters:  07/26/16 5' 0.5" (1.537 m)  03/08/16 5' 0.5" (1.537 m)  06/28/15 5' 0.5" (1.537 m)    General appearance: alert, cooperative and appears stated age Head: Normocephalic, without obvious abnormality, atraumatic Neck: no adenopathy, supple, symmetrical, trachea midline and thyroid normal to inspection and palpation Lungs: clear to auscultation bilaterally Breasts: normal appearance, no masses or tenderness Heart: regular rate and rhythm Abdomen: soft, non-tender; no masses,  no organomegaly Extremities: extremities normal, atraumatic, no cyanosis or edema Skin: Skin color, texture, turgor normal. No rashes or lesions Lymph nodes: Cervical, supraclavicular, and axillary nodes normal. No abnormal inguinal nodes palpated Neurologic: Grossly normal   Pelvic: External genitalia:  no lesions              Urethra:  normal appearing urethra with no masses, tenderness or lesions              Bartholin's and Skene's: normal                 Vagina: normal appearing vagina with normal color and discharge, no lesions              Cervix: absent              Pap taken: No. Bimanual Exam:  Uterus:  uterus absent              Adnexa: no mass, fullness, tenderness               Rectovaginal: Confirms               Anus:  normal sphincter tone, no lesions  Chaperone present: yes  A:  Well Woman with normal exam  Status post TVH and prolapse repair 09/2010 Atrophic vagina.  Osteopenia             History of HSV   P:   Reviewed health and wellness pertinent to exam  Pap smear: no  Mammogram is due 08/2016  Refill Valtrex for prn use  IFOB is given  She had genetic testing done with [facility] ? And will send those results  Counseled on breast self exam, mammography screening, adequate intake of calcium and vitamin D, diet and exercise,  Kegel's exercises return annually or prn  An After Visit  Summary was printed and given to the patient.

## 2016-07-26 NOTE — Patient Instructions (Signed)

## 2016-07-27 NOTE — Progress Notes (Signed)
Encounter reviewed by Dr. Brook Amundson C. Silva.  

## 2016-07-28 ENCOUNTER — Encounter: Payer: Self-pay | Admitting: Family Medicine

## 2016-08-21 LAB — FECAL OCCULT BLOOD, IMMUNOCHEMICAL: IFOBT: NEGATIVE

## 2016-08-21 NOTE — Addendum Note (Signed)
Addended by: Zoila Shutter D on: 08/21/2016 10:19 AM   Modules accepted: Orders

## 2016-08-23 LAB — HM MAMMOGRAPHY

## 2016-08-25 ENCOUNTER — Encounter: Payer: Self-pay | Admitting: Family Medicine

## 2016-09-07 ENCOUNTER — Encounter: Payer: Self-pay | Admitting: Nurse Practitioner

## 2016-10-25 ENCOUNTER — Ambulatory Visit (INDEPENDENT_AMBULATORY_CARE_PROVIDER_SITE_OTHER): Payer: 59 | Admitting: Internal Medicine

## 2016-10-25 ENCOUNTER — Encounter: Payer: Self-pay | Admitting: Internal Medicine

## 2016-10-25 VITALS — BP 150/80 | HR 83 | Temp 98.5°F | Wt 157.7 lb

## 2016-10-25 DIAGNOSIS — B9789 Other viral agents as the cause of diseases classified elsewhere: Secondary | ICD-10-CM | POA: Diagnosis not present

## 2016-10-25 DIAGNOSIS — J069 Acute upper respiratory infection, unspecified: Secondary | ICD-10-CM | POA: Diagnosis not present

## 2016-10-25 DIAGNOSIS — J040 Acute laryngitis: Secondary | ICD-10-CM

## 2016-10-25 NOTE — Progress Notes (Signed)
Chief Complaint  Patient presents with  . Cough    HPI: Laura Brooks 66 y.o.  sda    Onset about 8 days ago  hoarse cough at the sem days as father and 2 others  On vacation at beach     Father  Age 71 dx Viral invfection and given Z pack.that made no difference but now getting better.  Dry cough   Never fever.  Sob  Had r sinus pain yesterday  Better now  No hemoptysis stridor  Sob  . No meds  Warm liquids lemon etc   Just go t back  On train from [place]  ROS: See pertinent positives and negatives per HPI. No exposures  2 young well grandchildren    Past Medical History:  Diagnosis Date  . Benign microscopic hematuria    neg urol eval in past  . HEMATURIA UNSPECIFIED 05/17/2007   Qualifier: Diagnosis of  By: Hulan Saas, CMA (AAMA), Quita Skye   . HYPERLIPIDEMIA 04/08/2008   Qualifier: Diagnosis of  By: Regis Bill MD, Standley Brooking   . Hypertension   . Leukopenia 06/08/2010   Very mild   Has thyroid disease    Poss related to rx     No sx  Will repeat when convenient.    . Osteopenia    dexa 2009   . Osteoporosis    --hip  . Radiculopathy, lumbar region    L4L5  . STD (sexually transmitted disease)    HSV  . 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 Urinary incontinence    with coughing and sneezing    Family History  Problem Relation Age of Onset  . Anemia Father   . Hypertension Father 8  . Diabetes Father   . Thyroid disease Sister   . Hyperlipidemia Sister   . Hypertension Mother 38  . Thyroid disease Mother   . Breast cancer Paternal Aunt        breast cancerr with possible metaisis  . Breast cancer Paternal Grandmother     Social History   Social History  . Marital status: Divorced    Spouse name: N/A  . Number of children: N/A  . Years of education: N/A   Occupational History  . [employer]  . ASSIST OF DIRECT FUND  [employer]   Social History Main Topics  . Smoking status: Former Research scientist (life sciences)  . Smokeless  tobacco: Never Used  . Alcohol use 0.5 oz/week    1 Standard drinks or equivalent per week  . Drug use: No  . Sexual activity: Yes    Partners: Male    Birth control/ protection: Surgical     Comment: TVH 2012   Other Topics Concern  . None   Social History Narrative   Divorced former Smoker   Works Administrator, [employer]  Pet fish and parrot.    Outpatient Medications Prior to Visit  Medication Sig Dispense Refill  . Ascorbic Acid (VITAMIN C) 1000 MG tablet Take 1,000 mg by mouth daily.      . Calcium Carbonate-Vitamin D (CALCIUM + D) 600-200 MG-UNIT TABS Take 1 capsule by mouth daily.      Marland Kitchen CRANBERRY EXTRACT PO Take 4,200 mg by mouth 2 (two) times daily.      . fish oil-omega-3 fatty acids 1000 MG capsule Take 1 g by mouth daily.      Marland Kitchen levothyroxine (SYNTHROID, LEVOTHROID) 75  MCG tablet Take 75 mcg by mouth daily.  3  . losartan (COZAAR) 50 MG tablet TAKE 1 TABLET (50 MG TOTAL) BY MOUTH DAILY.**FILL3/29/2017** 90 tablet 3  . Magnesium 100 MG CAPS Take 1 capsule by mouth daily.      . Multiple Vitamins-Minerals (MULTIVITAMIN WITH MINERALS) tablet Take 1 tablet by mouth daily.      Marland Kitchen PREVIDENT 5000 SENSITIVE 1.1-5 % PSTE BRUSH TWICE DAILY  4  . valACYclovir (VALTREX) 500 MG tablet Take 1 tablet (500 mg total) by mouth 2 (two) times daily. (Patient not taking: Reported on 10/25/2016) 30 tablet 1   No facility-administered medications prior to visit.      EXAM:  BP (!) 150/80 (BP Location: Right Arm, Patient Position: Sitting, Cuff Size: Normal)   Pulse 83   Temp 98.5 F (36.9 C) (Oral)   Wt 157 lb 11.2 oz (71.5 kg)   LMP 04/10/2002   SpO2 98%   BMI 30.29 kg/m   Body mass index is 30.29 kg/m.  GENERAL: vitals reviewed and listed above, alert, oriented, appears well hydrated and in no acute distress mild hoarse no  stridor HEENT: atraumatic, conjunctiva  clear, no obvious abnormalities on inspection of external nose and ears tms nl OP : no lesion edema or  exudate  No face tenderness NECK: no obvious masses on inspection palpation  No ln  LUNGS: clear to auscultation bilaterally, no wheezes, rales or rhonchi, good air movement CV: HRRR, no clubbing cyanosis or  peripheral edema nl cap refill  MS: moves all extremities without noticeable focal  abnormality PSYCH: pleasant and cooperative, no obvious depression or anxiety  ASSESSMENT AND PLAN:  Discussed the following assessment and plan:  Viral laryngitis - appreasa uncomlicated  context typical epxectant managment and fu alarm sx sx help  Viral URI with cough  -Patient advised to return or notify health care team  if symptoms worsen ,persist or new concerns arise.  Patient Instructions  I think this is ia vrial respiratory infection laryngitis .   But   Add nasal cortisone and saline   If getting sinus sx .   Voice rest and humidity and runs its course.    If get fever  Chills shortness of breath or serious pain seek fu care.   Otherwise the sx could last another week .     Laryngitis Laryngitis is inflammation of your vocal cords. This causes hoarseness, coughing, loss of voice, sore throat, or a dry throat. Your vocal cords are two bands of muscles that are found in your throat. When you speak, these cords come together and vibrate. These vibrations come out through your mouth as sound. When your vocal cords are inflamed, your voice sounds different. Laryngitis can be temporary (acute) or long-term (chronic). Most cases of acute laryngitis improve with time. Chronic laryngitis is laryngitis that lasts for more than three weeks. What are the causes? Acute laryngitis may be caused by:  A viral infection.  Lots of talking, yelling, or singing. This is also called vocal strain.  Bacterial infections.  Chronic laryngitis may be caused by:  Vocal strain.  Injury to your vocal cords.  Acid reflux (gastroesophageal reflux disease or GERD).  Allergies.  Sinus  infection.  Smoking.  Alcohol abuse.  Breathing in chemicals or dust.  Growths on the vocal cords.  What increases the risk? Risk factors for laryngitis include:  Smoking.  Alcohol abuse.  Having allergies.  What are the signs or symptoms? Symptoms of laryngitis may  include:  Low, hoarse voice.  Loss of voice.  Dry cough.  Sore throat.  Stuffy nose.  How is this diagnosed? Laryngitis may be diagnosed by:  Physical exam.  Throat culture.  Blood test.  Laryngoscopy. This procedure allows your health care provider to look at your vocal cords with a mirror or viewing tube.  How is this treated? Treatment for laryngitis depends on what is causing it. Usually, treatment involves resting your voice and using medicines to soothe your throat. However, if your laryngitis is caused by a bacterial infection, you may need to take antibiotic medicine. If your laryngitis is caused by a growth, you may need to have a procedure to remove it. Follow these instructions at home:  Drink enough fluid to keep your urine clear or pale yellow.  Breathe in moist air. Use a humidifier if you live in a dry climate.  Take medicines only as directed by your health care provider.  If you were prescribed an antibiotic medicine, finish it all even if you start to feel better.  Do not smoke cigarettes or electronic cigarettes. If you need help quitting, ask your health care provider.  Talk as little as possible. Also avoid whispering, which can cause vocal strain.  Write instead of talking. Do this until your voice is back to normal. Contact a health care provider if:  You have a fever.  You have increasing pain.  You have difficulty swallowing. Get help right away if:  You cough up blood.  You have trouble breathing. This information is not intended to replace advice given to you by your health care provider. Make sure you discuss any questions you have with your health care  provider. Document Released: 03/27/2005 Document Revised: 09/02/2015 Document Reviewed: 09/09/2013 Elsevier Interactive Patient Education  2018 [place]. Panosh M.D.

## 2016-10-25 NOTE — Patient Instructions (Signed)
I think this is ia vrial respiratory infection laryngitis .   But   Add nasal cortisone and saline   If getting sinus sx .   Voice rest and humidity and runs its course.    If get fever  Chills shortness of breath or serious pain seek fu care.   Otherwise the sx could last another week .     Laryngitis Laryngitis is inflammation of your vocal cords. This causes hoarseness, coughing, loss of voice, sore throat, or a dry throat. Your vocal cords are two bands of muscles that are found in your throat. When you speak, these cords come together and vibrate. These vibrations come out through your mouth as sound. When your vocal cords are inflamed, your voice sounds different. Laryngitis can be temporary (acute) or long-term (chronic). Most cases of acute laryngitis improve with time. Chronic laryngitis is laryngitis that lasts for more than three weeks. What are the causes? Acute laryngitis may be caused by:  A viral infection.  Lots of talking, yelling, or singing. This is also called vocal strain.  Bacterial infections.  Chronic laryngitis may be caused by:  Vocal strain.  Injury to your vocal cords.  Acid reflux (gastroesophageal reflux disease or GERD).  Allergies.  Sinus infection.  Smoking.  Alcohol abuse.  Breathing in chemicals or dust.  Growths on the vocal cords.  What increases the risk? Risk factors for laryngitis include:  Smoking.  Alcohol abuse.  Having allergies.  What are the signs or symptoms? Symptoms of laryngitis may include:  Low, hoarse voice.  Loss of voice.  Dry cough.  Sore throat.  Stuffy nose.  How is this diagnosed? Laryngitis may be diagnosed by:  Physical exam.  Throat culture.  Blood test.  Laryngoscopy. This procedure allows your health care provider to look at your vocal cords with a mirror or viewing tube.  How is this treated? Treatment for laryngitis depends on what is causing it. Usually, treatment involves  resting your voice and using medicines to soothe your throat. However, if your laryngitis is caused by a bacterial infection, you may need to take antibiotic medicine. If your laryngitis is caused by a growth, you may need to have a procedure to remove it. Follow these instructions at home:  Drink enough fluid to keep your urine clear or pale yellow.  Breathe in moist air. Use a humidifier if you live in a dry climate.  Take medicines only as directed by your health care provider.  If you were prescribed an antibiotic medicine, finish it all even if you start to feel better.  Do not smoke cigarettes or electronic cigarettes. If you need help quitting, ask your health care provider.  Talk as little as possible. Also avoid whispering, which can cause vocal strain.  Write instead of talking. Do this until your voice is back to normal. Contact a health care provider if:  You have a fever.  You have increasing pain.  You have difficulty swallowing. Get help right away if:  You cough up blood.  You have trouble breathing. This information is not intended to replace advice given to you by your health care provider. Make sure you discuss any questions you have with your health care provider. Document Released: 03/27/2005 Document Revised: 09/02/2015 Document Reviewed: 09/09/2013 Elsevier Interactive Patient Education  [employer].

## 2016-12-22 ENCOUNTER — Other Ambulatory Visit: Payer: Self-pay | Admitting: Internal Medicine

## 2016-12-29 ENCOUNTER — Encounter: Payer: Self-pay | Admitting: Internal Medicine

## 2017-02-23 ENCOUNTER — Other Ambulatory Visit: Payer: 59

## 2017-03-09 ENCOUNTER — Encounter: Payer: 59 | Admitting: Internal Medicine

## 2017-05-17 NOTE — Progress Notes (Signed)
Chief Complaint  Patient presents with  . Annual Exam    no new concern    HPI: Patient  Laura Brooks  67 y.o. comes in today for Preventive Health Care visit and Chronic disease management  Bp no se omeds   Thyroid  Taking med no problem   Could  better with exercise 'not getting flu vaccine this year  ? Controversy about it   trying cbd oil for a month or so and feels better .   Health Maintenance  Topic Date Due  . INFLUENZA VACCINE  01/18/2018 (Originally 11/08/2016)  . MAMMOGRAM  08/23/2017  . COLONOSCOPY  08/24/2020  . TETANUS/TDAP  01/11/2022  . DEXA SCAN  Completed  . Hepatitis C Screening  Completed  . 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 GI /GU: No adominal pain, vomiting, change in bowel habits. No blood in the stool. No significant GU symptoms. SKIN/HEME: ,no acute skin rashes suspicious lesions or bleeding. No lymphadenopathy, nodules, masses.  NEURO/ PSYCH:  No neurologic signs such as weakness numbness. No depression anxiety. IMM/ Allergy: No unusual infections.  Allergy .   REST of 12 system review negative except as per HPI   Past Medical History:  Diagnosis Date  . Benign microscopic hematuria    neg urol eval in past  . HEMATURIA UNSPECIFIED 05/17/2007   Qualifier: Diagnosis of  By: Hulan Saas, CMA (AAMA), Q[place]a Skye   . HYPERLIPIDEMIA 04/08/2008   Qualifier: Diagnosis of  By: Regis Bill MD, Standley Brooking   . Hypertension   . Leukopenia 06/08/2010   Very mild   Has thyroid disease    Poss related to rx     No sx  Will repeat when  convenient.    . Osteopenia    dexa 2009   . Osteoporosis    --hip  . Radiculopathy, lumbar region    L4L5  . STD (sexually transmitted disease)    HSV  . 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 Urinary incontinence    with coughing and sneezing    Past Surgical History:  Procedure Laterality Date  . BLADDER SUSPENSION  09/2010  . COLONOSCOPY    . TONSILLECTOMY    . TOTAL VAGINAL HYSTERECTOMY    . WISDOM TOOTH EXTRACTION      Family History  Problem Relation Age of Onset  . Anemia Father   . Hypertension Father 35  . Diabetes Father   . Thyroid disease Sister   . Hyperlipidemia Sister   . Hypertension Mother 3  . Thyroid disease Mother   . Breast cancer Paternal Aunt        breast cancerr with possible metaisis  . Breast cancer Paternal Grandmother    Sisters on statin meds  Social History   Socioeconomic History  . Marital status: Divorced    Spouse name: None  . Number of children: None  . Years of education: None  . Highest  education level: None  Social Needs  . Financial resource strain: None  . Food insecurity - worry: None  . Food insecurity - inability: None  . Transportation needs - medical: None  . Transportation needs - non-medical: None  Occupational History  . Occupation: Herbalist: [employer]  . Occupation: ASSIST OF DIRECT FUND     Employer: [employer]  Tobacco Use  . Smoking status: Former Research scientist (life sciences)  . Smokeless tobacco: Never Used  Substance and Sexual Activity  . Alcohol use: Yes    Alcohol/week: 0.5 oz    Types: 1 Standard drinks or equivalent per week  . Drug use: No  . Sexual activity: Yes    Partners: Male    Birth control/protection: Surgical    Comment: [facility] 2012  Other Topics Concern  . None  Social History Narrative   Divorced former Smoker   Works Administrator, [employer]  Pet fish and parrot.    Outpatient Medications Prior to Visit  Medication Sig  Dispense Refill  . Ascorbic Acid (VITAMIN C) 1000 MG tablet Take 1,000 mg by mouth daily.      . Calcium Carbonate-Vitamin D (CALCIUM + D) 600-200 MG-UNIT TABS Take 1 capsule by mouth daily.      Marland Kitchen CRANBERRY EXTRACT PO Take 4,200 mg by mouth 2 (two) times daily.      . fish oil-omega-3 fatty acids 1000 MG capsule Take 1 g by mouth daily.      Marland Kitchen levothyroxine (SYNTHROID, LEVOTHROID) 75 MCG tablet Take 75 mcg by mouth daily.  3  . losartan (COZAAR) 50 MG tablet TAKE 1 TABLET (50 MG TOTAL) BY MOUTH DAILY.**FILL3/29/2017** 90 tablet 3  . Magnesium 100 MG CAPS Take 1 capsule by mouth daily.      . valACYclovir (VALTREX) 500 MG tablet Take 1 tablet (500 mg total) by mouth 2 (two) times daily. 30 tablet 1  . levothyroxine (SYNTHROID, LEVOTHROID) 75 MCG tablet TAKE 1 TABLET BY MOUTH DAILY. (Patient not taking: Reported on 05/18/2017) 90 tablet 1   No facility-administered medications prior to visit.      EXAM:  BP 126/82 (BP Location: Right Arm, Patient Position: Sitting, Cuff Size: Normal)   Pulse 84   Temp 98 F (36.7 C) (Oral)   Ht 5' 0.25" (1.53 m)   Wt 160 lb 6.4 oz (72.8 kg)   LMP 04/10/2002   BMI 31.07 kg/m   Body mass index is 31.07 kg/m. Wt Readings from Last 3 Encounters:  05/18/17 160 lb 6.4 oz (72.8 kg)  10/25/16 157 lb 11.2 oz (71.5 kg)  07/26/16 158 lb (71.7 kg)    Physical Exam: Vital signs reviewed ZCH:YIFO is a well-developed well-nourished alert cooperative    who appearsr stated age in no acute distress.  HEENT: normocephalic atraumatic , Eyes: PERRL EOM's full, conjunctiva clear, Nares: paten,t no deformity discharge or tenderness., Ears: no deformity EAC's clear TMs with normal landmarks. Mouth: clear OP, no lesions, edema.  Moist mucous membranes. Dentition in adequate repair. NECK: supple without masses, thyromegaly or bruits. CHEST/PULM:  Clear to auscultation and percussion breath sounds equal no wheeze , rales or rhonchi. No chest wall deformities or  tenderness. Breast: normal by inspection . No dimpling, discharge, masses, tenderness or discharge . CV: PMI is nondisplaced, S1 S2 no gallops, murmurs, rubs. Peripheral pulses are full without delay.No JVD .  ABDOMEN: Bowel sounds normal nontender  No guard or rebound, no hepato  splenomegal no CVA tenderness.  No hernia. Extremtities:  No clubbing cyanosis or edema, no acute joint swelling or redness no focal atrophy NEURO:  Oriented x3, cranial nerves 3-12 appear to be intact, no obvious focal weakness,gait within normal limits no abnormal reflexes or asymmetrical SKIN: No acute rashes normal turgor, color, no bruising or petechiae. PSYCH: Oriented, good eye contact, no obvious depression anxiety, cognition and judgment appear normal. LN: no cervical axillary inguinal adenopathy  Lab Results  Component Value Date   WBC 4.8 07/05/2016   HGB 12.5 07/05/2016   HCT 36.5 07/05/2016   PLT 270.0 07/05/2016   GLUCOSE 100 (H) 02/24/2016   CHOL 219 (H) 02/24/2016   TRIG 252.0 (H) 02/24/2016   HDL 43.00 02/24/2016   LDLDIRECT 131.0 02/24/2016   LDLCALC 118 (H) 10/31/2013   ALT 20 02/24/2016   AST 17 02/24/2016   NA 141 02/24/2016   K 4.3 02/24/2016   CL 106 02/24/2016   CREATININE 0.90 02/24/2016   BUN 16 02/24/2016   CO2 28 02/24/2016   TSH 2.13 02/24/2016   INR 1.08 09/20/2010    BP Readings from Last 3 Encounters:  05/18/17 126/82  10/25/16 (!) 150/80  07/26/16 136/78   Wt Readings from Last 3 Encounters:  05/18/17 160 lb 6.4 oz (72.8 kg)  10/25/16 157 lb 11.2 oz (71.5 kg)  07/26/16 158 lb (71.7 kg)    Had hand full of cheerios this am   Pre lab   ASSESSMENT AND PLAN:  Discussed the following assessment and plan:  Visit for preventive health examination - Plan: Basic metabolic panel, CBC with Differential/Platelet, Hepatic function panel, Lipid panel, TSH, Hemoglobin A1c  Essential hypertension - Plan: Basic metabolic panel, CBC with Differential/Platelet, Hepatic  function panel, Lipid panel, TSH, Hemoglobin A1c  Hypothyroidism, postablative - Plan: Basic metabolic panel, CBC with Differential/Platelet, Hepatic function panel, Lipid panel, TSH, Hemoglobin A1c  Medication management - Plan: Basic metabolic panel, CBC with Differential/Platelet, Hepatic function panel, Lipid panel, TSH, Hemoglobin A1c  HYPERTRIGLYCERIDEMIA - Plan: Basic metabolic panel, CBC with Differential/Platelet, Hepatic function panel, Lipid panel, TSH, Hemoglobin A1c  Hyperglycemia - borderline in past   - Plan: Basic metabolic panel, CBC with Differential/Platelet, Hepatic function panel, Lipid panel, TSH, Hemoglobin A1c  Influenza vaccination declined by patient  flu vaccine  Declined see above  consdier statin med with   Guidelines  Risk benefit  Aware of caution with supplements   Not regulated  Shingles vaccine  Discussed  Patient Care Team: Marna Weniger, Standley Brooking, MD as PCP - General Clarisa Fling, MD (Inactive) as Referring Physician (Internal Medicine) Kem Boroughs, FNP as Nurse Practitioner (Family Medicine) Patient Instructions  Healthy lifestyle includes : At least 150 minutes of exercise weeks  , weight at healthy levels, which is usually   BMI 19-25. Avoid trans fats and processed foods;  Increase fresh fruits and veges to 5 servings per day. And avoid sweet beverages including tea and juice. Mediterranean diet with olive oil and nuts have been noted to be heart and brain healthy . Avoid tobacco products . Limit  alcohol to  7 per week for women and 14 servings for men.  Get adequate sleep . Wear seat belts . Don't text and drive .  The 10-year ASCVD risk score Mikey Bussing DC Brooke Bonito., et al., 2013) is: 10%   Values used to calculate the score:     Age: 12 years     Sex: Female     Is Non-Hispanic African American: No  Diabetic: No     Tobacco smoker: No     Systolic Blood Pressure: 412 mmHg     Is BP treated: Yes     HDL Cholesterol: 43 mg/dL     Total  Cholesterol: 219 mg/dL   Consideration of statin medication    Will notify you  of labs when available.   Health Maintenance, Female Adopting a healthy lifestyle and getting preventive care can go a long way to promote health and wellness. Talk with your health care provider about what schedule of regular examinations is right for you. This is a good chance for you to check in with your provider about disease prevention and staying healthy. In between checkups, there are plenty of things you can do on your own. Experts have done a lot of research about which lifestyle changes and preventive measures are most likely to keep you healthy. Ask your health care provider for more information. Weight and diet Eat a healthy diet  Be sure to include plenty of vegetables, fruits, low-fat dairy products, and lean protein.  Do not eat a lot of foods high in solid fats, added sugars, or salt.  Get regular exercise. This is one of the most important things you can do for your health. ? Most adults should exercise for at least 150 minutes each week. The exercise should increase your heart rate and make you sweat (moderate-intensity exercise). ? Most adults should also do strengthening exercises at least twice a week. This is in addition to the moderate-intensity exercise.  Maintain a healthy weight  Body mass index (BMI) is a measurement that can be used to identify possible weight problems. It estimates body fat based on height and weight. Your health care provider can help determine your BMI and help you achieve or maintain a healthy weight.  For females 2 years of age and older: ? A BMI below 18.5 is considered underweight. ? A BMI of 18.5 to 24.9 is normal. ? A BMI of 25 to 29.9 is considered overweight. ? A BMI of 30 and above is considered obese.  Watch levels of cholesterol and blood lipids  You should start having your blood tested for lipids and cholesterol at 68 years of age, then have  this test every 5 years.  You may need to have your cholesterol levels checked more often if: ? Your lipid or cholesterol levels are high. ? You are older than 68 years of age. ? You are at high risk for heart disease.  Cancer screening Lung Cancer  Lung cancer screening is recommended for adults 13-62 years old who are at high risk for lung cancer because of a history of smoking.  A yearly low-dose CT scan of the lungs is recommended for people who: ? Currently smoke. ? Have quit within the past 15 years. ? Have at least a 30-pack-year history of smoking. A pack year is smoking an average of one pack of cigarettes a day for 1 year.  Yearly screening should continue until it has been 15 years since you quit.  Yearly screening should stop if you develop a health problem that would prevent you from having lung cancer treatment.  Breast Cancer  Practice breast self-awareness. This means understanding how your breasts normally appear and feel.  It also means doing regular breast self-exams. Let your health care provider know about any changes, no matter how small.  If you are in your 20s or 30s, you should have a clinical breast exam (CBE)  by a health care provider every 1-3 years as part of a regular health exam.  If you are 65 or older, have a CBE every year. Also consider having a breast X-ray (mammogram) every year.  If you have a family history of breast cancer, talk to your health care provider about genetic screening.  If you are at high risk for breast cancer, talk to your health care provider about having an MRI and a mammogram every year.  Breast cancer gene (BRCA) assessment is recommended for women who have family members with BRCA-related cancers. BRCA-related cancers include: ? Breast. ? Ovarian. ? Tubal. ? Peritoneal cancers.  Results of the assessment will determine the need for genetic counseling and BRCA1 and BRCA2 testing.  Cervical Cancer Your health care  provider may recommend that you be screened regularly for cancer of the pelvic organs (ovaries, uterus, and vagina). This screening involves a pelvic examination, including checking for microscopic changes to the surface of your cervix (Pap test). You may be encouraged to have this screening done every 3 years, beginning at age 49.  For women ages 13-65, health care providers may recommend pelvic exams and Pap testing every 3 years, or they may recommend the Pap and pelvic exam, combined with testing for human papilloma virus (HPV), every 5 years. Some types of HPV increase your risk of cervical cancer. Testing for HPV may also be done on women of any age with unclear Pap test results.  Other health care providers may not recommend any screening for nonpregnant women who are considered low risk for pelvic cancer and who do not have symptoms. Ask your health care provider if a screening pelvic exam is right for you.  If you have had past treatment for cervical cancer or a condition that could lead to cancer, you need Pap tests and screening for cancer for at least 20 years after your treatment. If Pap tests have been discontinued, your risk factors (such as having a new sexual partner) need to be reassessed to determine if screening should resume. Some women have medical problems that increase the chance of getting cervical cancer. In these cases, your health care provider may recommend more frequent screening and Pap tests.  Colorectal Cancer  This type of cancer can be detected and often prevented.  Routine colorectal cancer screening usually begins at 68 years of age and continues through 68 years of age.  Your health care provider may recommend screening at an earlier age if you have risk factors for colon cancer.  Your health care provider may also recommend using home test kits to check for hidden blood in the stool.  A small camera at the end of a tube can be used to examine your colon  directly (sigmoidoscopy or colonoscopy). This is done to check for the earliest forms of colorectal cancer.  Routine screening usually begins at age 69.  Direct examination of the colon should be repeated every 5-10 years through 68 years of age. However, you may need to be screened more often if early forms of precancerous polyps or small growths are found.  Skin Cancer  Check your skin from head to toe regularly.  Tell your health care provider about any new moles or changes in moles, especially if there is a change in a mole's shape or color.  Also tell your health care provider if you have a mole that is larger than the size of a pencil eraser.  Always use sunscreen. Apply sunscreen liberally and  repeatedly throughout the day.  Protect yourself by wearing long sleeves, pants, a wide-brimmed hat, and sunglasses whenever you are outside.  Heart disease, diabetes, and high blood pressure  High blood pressure causes heart disease and increases the risk of stroke. High blood pressure is more likely to develop in: ? People who have blood pressure in the high end of the normal range (130-139/85-89 mm Hg). ? People who are overweight or obese. ? People who are African American.  If you are 1-52 years of age, have your blood pressure checked every 3-5 years. If you are 12 years of age or older, have your blood pressure checked every year. You should have your blood pressure measured twice-once when you are at a hospital or clinic, and once when you are not at a hospital or clinic. Record the average of the two measurements. To check your blood pressure when you are not at a hospital or clinic, you can use: ? An automated blood pressure machine at a pharmacy. ? A home blood pressure monitor.  If you are between 3 years and 37 years old, ask your health care provider if you should take aspirin to prevent strokes.  Have regular diabetes screenings. This involves taking a blood sample to  check your fasting blood sugar level. ? If you are at a normal weight and have a low risk for diabetes, have this test once every three years after 68 years of age. ? If you are overweight and have a high risk for diabetes, consider being tested at a younger age or more often. Preventing infection Hepatitis B  If you have a higher risk for hepatitis B, you should be screened for this virus. You are considered at high risk for hepatitis B if: ? You were born in a country where hepatitis B is common. Ask your health care provider which countries are considered high risk. ? Your parents were born in a high-risk country, and you have not been immunized against hepatitis B (hepatitis B vaccine). ? You have HIV or AIDS. ? You use needles to inject street drugs. ? You live with someone who has hepatitis B. ? You have had sex with someone who has hepatitis B. ? You get hemodialysis treatment. ? You take certain medicines for conditions, including cancer, organ transplantation, and autoimmune conditions.  Hepatitis C  Blood testing is recommended for: ? Everyone born from [birth year] through 1966. ? Anyone with known risk factors for hepatitis C.  Sexually transmitted infections (STIs)  You should be screened for sexually transmitted infections (STIs) including gonorrhea and chlamydia if: ? You are sexually active and are younger than 68 years of age. ? You are older than 68 years of age and your health care provider tells you that you are at risk for this type of infection. ? Your sexual activity has changed since you were last screened and you are at an increased risk for chlamydia or gonorrhea. Ask your health care provider if you are at risk.  If you do not have HIV, but are at risk, it may be recommended that you take a prescription medicine daily to prevent HIV infection. This is called pre-exposure prophylaxis (PrEP). You are considered at risk if: ? You are sexually active and do not regularly  use condoms or know the HIV status of your partner(s). ? You take drugs by injection. ? You are sexually active with a partner who has HIV.  Talk with your health care provider about whether you  are at high risk of being infected with HIV. If you choose to begin PrEP, you should first be tested for HIV. You should then be tested every 3 months for as long as you are taking PrEP. Pregnancy  If you are premenopausal and you may become pregnant, ask your health care provider about preconception counseling.  If you may become pregnant, take 400 to 800 micrograms (mcg) of folic acid every day.  If you want to prevent pregnancy, talk to your health care provider about birth control (contraception). Osteoporosis and menopause  Osteoporosis is a disease in which the bones lose minerals and strength with aging. This can result in serious bone fractures. Your risk for osteoporosis can be identified using a bone density scan.  If you are 97 years of age or older, or if you are at risk for osteoporosis and fractures, ask your health care provider if you should be screened.  Ask your health care provider whether you should take a calcium or vitamin D supplement to lower your risk for osteoporosis.  Menopause may have certain physical symptoms and risks.  Hormone replacement therapy may reduce some of these symptoms and risks. Talk to your health care provider about whether hormone replacement therapy is right for you. Follow these instructions at home:  Schedule regular health, dental, and eye exams.  Stay current with your immunizations.  Do not use any tobacco products including cigarettes, chewing tobacco, or electronic cigarettes.  If you are pregnant, do not drink alcohol.  If you are breastfeeding, limit how much and how often you drink alcohol.  Limit alcohol intake to no more than 1 drink per day for nonpregnant women. One drink equals 12 ounces of beer, 5 ounces of wine, or 1 ounces  of hard liquor.  Do not use street drugs.  Do not share needles.  Ask your health care provider for help if you need support or information about quitting drugs.  Tell your health care provider if you often feel depressed.  Tell your health care provider if you have ever been abused or do not feel safe at home. This information is not intended to replace advice given to you by your health care provider. Make sure you discuss any questions you have with your health care provider. Document Released: 10/10/2010 Document Revised: 09/02/2015 Document Reviewed: 12/29/2014 Elsevier Interactive Patient Education  2018 [place]. Wirt Hemmerich M.D.

## 2017-05-18 ENCOUNTER — Encounter: Payer: Self-pay | Admitting: Internal Medicine

## 2017-05-18 ENCOUNTER — Other Ambulatory Visit: Payer: 59

## 2017-05-18 ENCOUNTER — Ambulatory Visit (INDEPENDENT_AMBULATORY_CARE_PROVIDER_SITE_OTHER): Payer: 59 | Admitting: Internal Medicine

## 2017-05-18 VITALS — BP 126/82 | HR 84 | Temp 98.0°F | Ht 60.25 in | Wt 160.4 lb

## 2017-05-18 DIAGNOSIS — Z2821 Immunization not carried out because of patient refusal: Secondary | ICD-10-CM | POA: Diagnosis not present

## 2017-05-18 DIAGNOSIS — Z0000 Encounter for general adult medical examination without abnormal findings: Secondary | ICD-10-CM

## 2017-05-18 DIAGNOSIS — I10 Essential (primary) hypertension: Secondary | ICD-10-CM

## 2017-05-18 DIAGNOSIS — Z79899 Other long term (current) drug therapy: Secondary | ICD-10-CM

## 2017-05-18 DIAGNOSIS — R739 Hyperglycemia, unspecified: Secondary | ICD-10-CM

## 2017-05-18 DIAGNOSIS — E890 Postprocedural hypothyroidism: Secondary | ICD-10-CM

## 2017-05-18 DIAGNOSIS — E781 Pure hyperglyceridemia: Secondary | ICD-10-CM

## 2017-05-18 LAB — HEPATIC FUNCTION PANEL
ALT: 18 U/L (ref 0–35)
AST: 16 U/L (ref 0–37)
Albumin: 4 g/dL (ref 3.5–5.2)
Alkaline Phosphatase: 53 U/L (ref 39–117)
BILIRUBIN TOTAL: 0.5 mg/dL (ref 0.2–1.2)
Bilirubin, Direct: 0.1 mg/dL (ref 0.0–0.3)
TOTAL PROTEIN: 6.8 g/dL (ref 6.0–8.3)

## 2017-05-18 LAB — CBC WITH DIFFERENTIAL/PLATELET
BASOS PCT: 0.7 %
Basophils Absolute: 39 cells/uL (ref 0–200)
EOS PCT: 1.8 %
Eosinophils Absolute: 99 cells/uL (ref 15–500)
HCT: 36.4 % (ref 35.0–45.0)
HEMOGLOBIN: 12.3 g/dL (ref 11.7–15.5)
Lymphs Abs: 1639 cells/uL (ref 850–3900)
MCH: 30.8 pg (ref 27.0–33.0)
MCHC: 33.8 g/dL (ref 32.0–36.0)
MCV: 91 fL (ref 80.0–100.0)
MPV: 10.3 fL (ref 7.5–12.5)
Monocytes Relative: 9 %
NEUTROS ABS: 3229 {cells}/uL (ref 1500–7800)
Neutrophils Relative %: 58.7 %
Platelets: 263 10*3/uL (ref 140–400)
RBC: 4 10*6/uL (ref 3.80–5.10)
RDW: 12.6 % (ref 11.0–15.0)
Total Lymphocyte: 29.8 %
WBC mixed population: 495 cells/uL (ref 200–950)
WBC: 5.5 10*3/uL (ref 3.8–10.8)

## 2017-05-18 LAB — LIPID PANEL
CHOLESTEROL: 239 mg/dL — AB (ref 0–200)
HDL: 45.8 mg/dL (ref >39.00)
LDL Cholesterol: 154 mg/dL — ABNORMAL HIGH (ref 0–99)
NonHDL: 193.62
TRIGLYCERIDES: 196 mg/dL — AB (ref 0.0–149.0)
Total CHOL/HDL Ratio: 5
VLDL: 39.2 mg/dL (ref 0.0–40.0)

## 2017-05-18 LAB — BASIC METABOLIC PANEL
BUN: 25 mg/dL — AB (ref 6–23)
CALCIUM: 9 mg/dL (ref 8.4–10.5)
CO2: 31 meq/L (ref 19–32)
CREATININE: 0.85 mg/dL (ref 0.40–1.20)
Chloride: 105 mEq/L (ref 96–112)
GFR: 70.8 mL/min (ref >60.00)
Glucose, Bld: 93 mg/dL (ref 70–99)
Potassium: 4.2 mEq/L (ref 3.5–5.1)
Sodium: 141 mEq/L (ref 135–145)

## 2017-05-18 LAB — HEMOGLOBIN A1C: HEMOGLOBIN A1C: 5.7 % (ref 4.6–6.5)

## 2017-05-18 LAB — TSH: TSH: 1.73 u[IU]/mL (ref 0.35–4.50)

## 2017-05-18 NOTE — Patient Instructions (Addendum)
Healthy lifestyle includes : At least 150 minutes of exercise weeks  , weight at healthy levels, which is usually   BMI 19-25. Avoid trans fats and processed foods;  Increase fresh fruits and veges to 5 servings per day. And avoid sweet beverages including tea and juice. Mediterranean diet with olive oil and nuts have been noted to be heart and brain healthy . Avoid tobacco products . Limit  alcohol to  7 per week for women and 14 servings for men.  Get adequate sleep . Wear seat belts . Don't text and drive .  The 10-year ASCVD risk score Mikey Bussing DC Brooke Bonito., et al., 2013) is: 10%   Values used to calculate the score:     Age: 67 years     Sex: Female     Is Non-Hispanic African American: No     Diabetic: No     Tobacco smoker: No     Systolic Blood Pressure: 224 mmHg     Is BP treated: Yes     HDL Cholesterol: 43 mg/dL     Total Cholesterol: 219 mg/dL   Consideration of statin medication    Will notify you  of labs when available.   Health Maintenance, Female Adopting a healthy lifestyle and getting preventive care can go a long way to promote health and wellness. Talk with your health care provider about what schedule of regular examinations is right for you. This is a good chance for you to check in with your provider about disease prevention and staying healthy. In between checkups, there are plenty of things you can do on your own. Experts have done a lot of research about which lifestyle changes and preventive measures are most likely to keep you healthy. Ask your health care provider for more information. Weight and diet Eat a healthy diet  Be sure to include plenty of vegetables, fruits, low-fat dairy products, and lean protein.  Do not eat a lot of foods high in solid fats, added sugars, or salt.  Get regular exercise. This is one of the most important things you can do for your health. ? Most adults should exercise for at least 150 minutes each week. The exercise should  increase your heart rate and make you sweat (moderate-intensity exercise). ? Most adults should also do strengthening exercises at least twice a week. This is in addition to the moderate-intensity exercise.  Maintain a healthy weight  Body mass index (BMI) is a measurement that can be used to identify possible weight problems. It estimates body fat based on height and weight. Your health care provider can help determine your BMI and help you achieve or maintain a healthy weight.  For females 18 years of age and older: ? A BMI below 18.5 is considered underweight. ? A BMI of 18.5 to 24.9 is normal. ? A BMI of 25 to 29.9 is considered overweight. ? A BMI of 30 and above is considered obese.  Watch levels of cholesterol and blood lipids  You should start having your blood tested for lipids and cholesterol at 68 years of age, then have this test every 5 years.  You may need to have your cholesterol levels checked more often if: ? Your lipid or cholesterol levels are high. ? You are older than 68 years of age. ? You are at high risk for heart disease.  Cancer screening Lung Cancer  Lung cancer screening is recommended for adults 68-75 years old who are at high risk for lung cancer  because of a history of smoking.  A yearly low-dose CT scan of the lungs is recommended for people who: ? Currently smoke. ? Have quit within the past 15 years. ? Have at least a 30-pack-year history of smoking. A pack year is smoking an average of one pack of cigarettes a day for 1 year.  Yearly screening should continue until it has been 15 years since you quit.  Yearly screening should stop if you develop a health problem that would prevent you from having lung cancer treatment.  Breast Cancer  Practice breast self-awareness. This means understanding how your breasts normally appear and feel.  It also means doing regular breast self-exams. Let your health care provider know about any changes, no matter  how small.  If you are in your 68 or 30s, you should have a clinical breast exam (CBE) by a health care provider every 1-3 years as part of a regular health exam.  If you are 68 or older, have a CBE every year. Also consider having a breast X-ray (mammogram) every year.  If you have a family history of breast cancer, talk to your health care provider about genetic screening.  If you are at high risk for breast cancer, talk to your health care provider about having an MRI and a mammogram every year.  Breast cancer gene (BRCA) assessment is recommended for women who have family members with BRCA-related cancers. BRCA-related cancers include: ? Breast. ? Ovarian. ? Tubal. ? Peritoneal cancers.  Results of the assessment will determine the need for genetic counseling and BRCA1 and BRCA2 testing.  Cervical Cancer Your health care provider may recommend that you be screened regularly for cancer of the pelvic organs (ovaries, uterus, and vagina). This screening involves a pelvic examination, including checking for microscopic changes to the surface of your cervix (Pap test). You may be encouraged to have this screening done every 3 years, beginning at age 22.  For women ages 4-65, health care providers may recommend pelvic exams and Pap testing every 3 years, or they may recommend the Pap and pelvic exam, combined with testing for human papilloma virus (HPV), every 5 years. Some types of HPV increase your risk of cervical cancer. Testing for HPV may also be done on women of any age with unclear Pap test results.  Other health care providers may not recommend any screening for nonpregnant women who are considered low risk for pelvic cancer and who do not have symptoms. Ask your health care provider if a screening pelvic exam is right for you.  If you have had past treatment for cervical cancer or a condition that could lead to cancer, you need Pap tests and screening for cancer for at least 20  years after your treatment. If Pap tests have been discontinued, your risk factors (such as having a new sexual partner) need to be reassessed to determine if screening should resume. Some women have medical problems that increase the chance of getting cervical cancer. In these cases, your health care provider may recommend more frequent screening and Pap tests.  Colorectal Cancer  This type of cancer can be detected and often prevented.  Routine colorectal cancer screening usually begins at 68 years of age and continues through 68 years of age.  Your health care provider may recommend screening at an earlier age if you have risk factors for colon cancer.  Your health care provider may also recommend using home test kits to check for hidden blood in the stool.  A small camera at the end of a tube can be used to examine your colon directly (sigmoidoscopy or colonoscopy). This is done to check for the earliest forms of colorectal cancer.  Routine screening usually begins at age 32.  Direct examination of the colon should be repeated every 5-10 years through 68 years of age. However, you may need to be screened more often if early forms of precancerous polyps or small growths are found.  Skin Cancer  Check your skin from head to toe regularly.  Tell your health care provider about any new moles or changes in moles, especially if there is a change in a mole's shape or color.  Also tell your health care provider if you have a mole that is larger than the size of a pencil eraser.  Always use sunscreen. Apply sunscreen liberally and repeatedly throughout the day.  Protect yourself by wearing long sleeves, pants, a wide-brimmed hat, and sunglasses whenever you are outside.  Heart disease, diabetes, and high blood pressure  High blood pressure causes heart disease and increases the risk of stroke. High blood pressure is more likely to develop in: ? People who have blood pressure in the high  end of the normal range (130-139/85-89 mm Hg). ? People who are overweight or obese. ? People who are African American.  If you are 39-61 years of age, have your blood pressure checked every 3-5 years. If you are 42 years of age or older, have your blood pressure checked every year. You should have your blood pressure measured twice-once when you are at a hospital or clinic, and once when you are not at a hospital or clinic. Record the average of the two measurements. To check your blood pressure when you are not at a hospital or clinic, you can use: ? An automated blood pressure machine at a pharmacy. ? A home blood pressure monitor.  If you are between 7 years and 5 years old, ask your health care provider if you should take aspirin to prevent strokes.  Have regular diabetes screenings. This involves taking a blood sample to check your fasting blood sugar level. ? If you are at a normal weight and have a low risk for diabetes, have this test once every three years after 68 years of age. ? If you are overweight and have a high risk for diabetes, consider being tested at a younger age or more often. Preventing infection Hepatitis B  If you have a higher risk for hepatitis B, you should be screened for this virus. You are considered at high risk for hepatitis B if: ? You were born in a country where hepatitis B is common. Ask your health care provider which countries are considered high risk. ? Your parents were born in a high-risk country, and you have not been immunized against hepatitis B (hepatitis B vaccine). ? You have HIV or AIDS. ? You use needles to inject street drugs. ? You live with someone who has hepatitis B. ? You have had sex with someone who has hepatitis B. ? You get hemodialysis treatment. ? You take certain medicines for conditions, including cancer, organ transplantation, and autoimmune conditions.  Hepatitis C  Blood testing is recommended for: ? Everyone born from  [birth year] through 1966. ? Anyone with known risk factors for hepatitis C.  Sexually transmitted infections (STIs)  You should be screened for sexually transmitted infections (STIs) including gonorrhea and chlamydia if: ? You are sexually active and are younger than 68  years of age. ? You are older than 68 years of age and your health care provider tells you that you are at risk for this type of infection. ? Your sexual activity has changed since you were last screened and you are at an increased risk for chlamydia or gonorrhea. Ask your health care provider if you are at risk.  If you do not have HIV, but are at risk, it may be recommended that you take a prescription medicine daily to prevent HIV infection. This is called pre-exposure prophylaxis (PrEP). You are considered at risk if: ? You are sexually active and do not regularly use condoms or know the HIV status of your partner(s). ? You take drugs by injection. ? You are sexually active with a partner who has HIV.  Talk with your health care provider about whether you are at high risk of being infected with HIV. If you choose to begin PrEP, you should first be tested for HIV. You should then be tested every 3 months for as long as you are taking PrEP. Pregnancy  If you are premenopausal and you may become pregnant, ask your health care provider about preconception counseling.  If you may become pregnant, take 400 to 800 micrograms (mcg) of folic acid every day.  If you want to prevent pregnancy, talk to your health care provider about birth control (contraception). Osteoporosis and menopause  Osteoporosis is a disease in which the bones lose minerals and strength with aging. This can result in serious bone fractures. Your risk for osteoporosis can be identified using a bone density scan.  If you are 45 years of age or older, or if you are at risk for osteoporosis and fractures, ask your health care provider if you should be  screened.  Ask your health care provider whether you should take a calcium or vitamin D supplement to lower your risk for osteoporosis.  Menopause may have certain physical symptoms and risks.  Hormone replacement therapy may reduce some of these symptoms and risks. Talk to your health care provider about whether hormone replacement therapy is right for you. Follow these instructions at home:  Schedule regular health, dental, and eye exams.  Stay current with your immunizations.  Do not use any tobacco products including cigarettes, chewing tobacco, or electronic cigarettes.  If you are pregnant, do not drink alcohol.  If you are breastfeeding, limit how much and how often you drink alcohol.  Limit alcohol intake to no more than 1 drink per day for nonpregnant women. One drink equals 12 ounces of beer, 5 ounces of wine, or 1 ounces of hard liquor.  Do not use street drugs.  Do not share needles.  Ask your health care provider for help if you need support or information about quitting drugs.  Tell your health care provider if you often feel depressed.  Tell your health care provider if you have ever been abused or do not feel safe at home. This information is not intended to replace advice given to you by your health care provider. Make sure you discuss any questions you have with your health care provider. Document Released: 10/10/2010 Document Revised: 09/02/2015 Document Reviewed: 12/29/2014 Elsevier Interactive Patient Education  [employer].

## 2017-06-19 ENCOUNTER — Other Ambulatory Visit: Payer: Self-pay | Admitting: Internal Medicine

## 2017-07-31 ENCOUNTER — Ambulatory Visit: Payer: 59 | Admitting: Certified Nurse Midwife

## 2017-08-03 ENCOUNTER — Ambulatory Visit: Payer: 59 | Admitting: Nurse Practitioner

## 2017-08-27 LAB — HM MAMMOGRAPHY

## 2017-08-29 ENCOUNTER — Telehealth: Payer: Self-pay | Admitting: Internal Medicine

## 2017-08-29 NOTE — Telephone Encounter (Signed)
Patient placed on wait list. She will be contacted to schedule when vaccine is available. 

## 2017-08-29 NOTE — Telephone Encounter (Signed)
Copied from [place] [id number]. Topic: Quick Communication - See Telephone Encounter >> Aug 29, 2017 11:24 AM Vernona Rieger wrote: CRM for notification. See Telephone encounter for: 08/29/17.  Patient is requesting to be put on the wait list for the [facility] Vaccine. Contact number is [phone]

## 2017-09-04 NOTE — Telephone Encounter (Signed)
Pt has been sch for 10-16-17

## 2017-09-04 NOTE — Telephone Encounter (Signed)
Patient calling and states that she received a call about getting her 2nd shingrix injection. States that she has heard from a few people that it has made them sick. States she is going out of town and would like to push getting the injection back until the week of 10/15/17. Please advise. CB#: [phone]

## 2017-09-04 NOTE — Telephone Encounter (Signed)
Called patient and left message to return call. Patient can schedule nurse visit at her convenience.  Per chart, she has not had Shingrix vaccine in the past and this will be her first injection.  If she received her first dose of Shingrix somewhere else, she should also receive her second dose at that location.

## 2017-09-12 ENCOUNTER — Encounter: Payer: Self-pay | Admitting: Internal Medicine

## 2017-09-19 ENCOUNTER — Telehealth: Payer: Self-pay | Admitting: Certified Nurse Midwife

## 2017-09-19 ENCOUNTER — Ambulatory Visit: Payer: 59 | Admitting: Certified Nurse Midwife

## 2017-09-19 NOTE — Telephone Encounter (Signed)
Patient canceled her appointment today due a personal issue. Patient just learned late yesterday that she is being laid off from her employer. Patient said she just needed a couple days to "take this in" and rescheduled to 09/21/17.

## 2017-09-21 ENCOUNTER — Other Ambulatory Visit: Payer: Self-pay

## 2017-09-21 ENCOUNTER — Encounter: Payer: Self-pay | Admitting: Certified Nurse Midwife

## 2017-09-21 ENCOUNTER — Ambulatory Visit (INDEPENDENT_AMBULATORY_CARE_PROVIDER_SITE_OTHER): Payer: 59 | Admitting: Certified Nurse Midwife

## 2017-09-21 VITALS — BP 124/76 | HR 76 | Resp 16 | Ht 60.5 in | Wt 161.0 lb

## 2017-09-21 DIAGNOSIS — Z780 Asymptomatic menopausal state: Secondary | ICD-10-CM | POA: Diagnosis not present

## 2017-09-21 DIAGNOSIS — Z1211 Encounter for screening for malignant neoplasm of colon: Secondary | ICD-10-CM | POA: Diagnosis not present

## 2017-09-21 DIAGNOSIS — Z01419 Encounter for gynecological examination (general) (routine) without abnormal findings: Secondary | ICD-10-CM | POA: Diagnosis not present

## 2017-09-21 NOTE — Progress Notes (Signed)
67 y.o. G75P2002 Divorced  Caucasian Fe here for annual exam. Post menopausal no HRT. Denies vaginal bleeding.Sees Dr. Regis Bill for aex,labs and hypertension, hypothyroid. Patient will be relocating due to job loss with [employer]. No health issues today.  Patient's last menstrual period was 04/10/2002.          Sexually active: No.  The current method of family planning is status post hysterectomy.    Exercising: No.   Smoker:  no  Health Maintenance: Pap:  09-14-09 neg History of Abnormal Pap: no MMG:  08-23-16 category b density birads 1:neg. Had MMG last week normal  Self Breast exams: yes Colonoscopy:  2012 f/u 65yrs BMD:   2018 TDaP:  2013 Shingles: 2012 Pneumonia: 2017 Hep C and HIV: hep c neg 2017  Labs: PCP   reports that she has quit smoking. She has never used smokeless tobacco. She reports that she drinks about 0.5 oz of alcohol per week. She reports that she does not use drugs.  Past Medical History:  Diagnosis Date  . Benign microscopic hematuria    neg urol eval in past  . HEMATURIA UNSPECIFIED 05/17/2007   Qualifier: Diagnosis of  By: Hulan Saas, CMA (AAMA), Quita Skye   . HYPERLIPIDEMIA 04/08/2008   Qualifier: Diagnosis of  By: Regis Bill MD, Standley Brooking   . Hypertension   . Leukopenia 06/08/2010   Very mild   Has thyroid disease    Poss related to rx     No sx  Will repeat when convenient.    . Osteopenia    dexa 2009   . Osteoporosis    --hip  . Radiculopathy, lumbar region    L4L5  . STD (sexually transmitted disease)    HSV  . 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 Urinary incontinence    with coughing and sneezing    Past Surgical History:  Procedure Laterality Date  . BLADDER SUSPENSION  09/2010  . COLONOSCOPY    . TONSILLECTOMY    . TOTAL VAGINAL HYSTERECTOMY    . WISDOM TOOTH EXTRACTION      Current Outpatient Medications  Medication Sig Dispense Refill  . Ascorbic Acid (VITAMIN C) 1000 MG tablet Take 1,000 mg by mouth daily.      . Calcium  Carbonate-Vitamin D (CALCIUM + D) 600-200 MG-UNIT TABS Take 1 capsule by mouth daily.      Marland Kitchen CRANBERRY EXTRACT PO Take 4,200 mg by mouth 2 (two) times daily.      . fish oil-omega-3 fatty acids 1000 MG capsule Take 1 g by mouth daily.      Marland Kitchen levothyroxine (SYNTHROID, LEVOTHROID) 75 MCG tablet TAKE 1 TABLET BY MOUTH DAILY. 90 tablet 1  . losartan (COZAAR) 50 MG tablet TAKE 1 TABLET (50 MG TOTAL) BY MOUTH DAILY. 90 tablet 3  . Magnesium 100 MG CAPS Take 1 capsule by mouth daily.      . valACYclovir (VALTREX) 500 MG tablet Take 1 tablet (500 mg total) by mouth 2 (two) times daily. (Patient taking differently: Take 500 mg by mouth 2 (two) times daily as needed. ) 30 tablet 1   No current facility-administered medications for this visit.     Family History  Problem Relation Age of Onset  . Anemia Father   . Hypertension Father 13  . Diabetes Father   . Thyroid disease Sister   . Hyperlipidemia Sister   . Hypertension Mother 57  . Thyroid disease Mother   . Breast cancer  Paternal Aunt        breast cancerr with possible metaisis  . Breast cancer Paternal Grandmother     ROS:  Pertinent items are noted in HPI.  Otherwise, a comprehensive ROS was negative.  Exam:   BP 124/76 (BP Location: Right Arm, Patient Position: Sitting, Cuff Size: Large)   Pulse 76   Resp 16   Ht 5' 0.5" (1.537 m)   Wt 161 lb (73 kg)   LMP 04/10/2002   BMI 30.93 kg/m  Height: 5' 0.5" (153.7 cm) Ht Readings from Last 3 Encounters:  09/21/17 5' 0.5" (1.537 m)  05/18/17 5' 0.25" (1.53 m)  07/26/16 5' 0.5" (1.537 m)    General appearance: alert, cooperative and appears stated age Head: Normocephalic, without obvious abnormality, atraumatic Neck: no adenopathy, supple, symmetrical, trachea midline and thyroid normal to inspection and palpation Lungs: clear to auscultation bilaterally Breasts: normal appearance, no masses or tenderness, No nipple retraction or dimpling, No nipple discharge or bleeding, No  axillary or supraclavicular adenopathy Heart: regular rate and rhythm Abdomen: soft, non-tender; no masses,  no organomegaly Extremities: extremities normal, atraumatic, no cyanosis or edema Skin: Skin color, texture, turgor normal. No rashes or lesions Lymph nodes: Cervical, supraclavicular, and axillary nodes normal. No abnormal inguinal nodes palpated Neurologic: Grossly normal   Pelvic: External genitalia:  no lesions              Urethra:  normal appearing urethra with no masses, tenderness or lesions              Bartholin's and Skene's: normal                 Vagina: normal appearing vagina with normal color and discharge, no lesions              Cervix: absent              Pap taken: No. Bimanual Exam:  Uterus:  uterus absent              Adnexa: no mass, fullness, tenderness               Rectovaginal: Confirms               Anus:  normal sphincter tone, no lesions  Chaperone present: yes  A:  Well Woman with normal exam  Menopausal  No HRT  PCP management of hypertension, cholesterol,Synthroid  Social stress with job loss    P:   Reviewed health and wellness pertinent to exam  Aware to advise if vaginal bleeding  Continue follow up with MD as indicated  Encouraged to seek friends and family to help during this time  Pap smear: no  counseled on breast self exam, mammography screening, feminine hygiene, menopause, adequate intake of calcium and vitamin D, diet and exercise return annually or prn  An After Visit Summary was printed and given to the patient.

## 2017-09-21 NOTE — Patient Instructions (Signed)

## 2017-09-25 ENCOUNTER — Ambulatory Visit (INDEPENDENT_AMBULATORY_CARE_PROVIDER_SITE_OTHER): Payer: 59 | Admitting: *Deleted

## 2017-09-25 DIAGNOSIS — Z23 Encounter for immunization: Secondary | ICD-10-CM | POA: Diagnosis not present

## 2017-09-25 NOTE — Progress Notes (Signed)
Per orders of Dr. Panosh, injection of Shingrix given by Fianna Snowball J Monai Hindes. Patient tolerated injection well. 

## 2017-09-27 LAB — FECAL OCCULT BLOOD, IMMUNOCHEMICAL: Fecal Occult Bld: NEGATIVE

## 2017-09-27 LAB — SPECIMEN STATUS REPORT

## 2017-10-16 ENCOUNTER — Ambulatory Visit: Payer: 59

## 2017-11-26 ENCOUNTER — Other Ambulatory Visit: Payer: Self-pay | Admitting: Internal Medicine

## 2019-07-01 ENCOUNTER — Encounter: Payer: Self-pay | Admitting: Certified Nurse Midwife

## 2019-12-12 NOTE — Telephone Encounter (Signed)
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  After that you became  Hypothyroid.   Wishing you well.  WP
